# Patient Record
Sex: Female | Born: 2000 | Smoking: Never smoker
Health system: Southern US, Community
[De-identification: ages and names within clinical notes are randomized; demographics above are authoritative.]

## PROBLEM LIST (undated history)

## (undated) DIAGNOSIS — I2699 Other pulmonary embolism without acute cor pulmonale: Secondary | ICD-10-CM

## (undated) HISTORY — DX: Other pulmonary embolism without acute cor pulmonale: I26.99

## (undated) HISTORY — PX: NO PAST SURGERIES: SHX2092

---
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
## 2018-01-26 ENCOUNTER — Emergency Department (HOSPITAL_COMMUNITY): Payer: 59

## 2018-01-26 ENCOUNTER — Inpatient Hospital Stay (HOSPITAL_COMMUNITY)
Admission: EM | Admit: 2018-01-26 | Discharge: 2018-01-29 | DRG: 175 | Disposition: A | Discharge status: Home / Self Care | Payer: 59 | Attending: Internal Medicine | Admitting: Internal Medicine

## 2018-01-26 ENCOUNTER — Other Ambulatory Visit: Payer: Self-pay

## 2018-01-26 ENCOUNTER — Encounter (HOSPITAL_COMMUNITY): Payer: Self-pay

## 2018-01-26 DIAGNOSIS — Z3009 Encounter for other general counseling and advice on contraception: Secondary | ICD-10-CM | POA: Diagnosis not present

## 2018-01-26 DIAGNOSIS — I2609 Other pulmonary embolism with acute cor pulmonale: Secondary | ICD-10-CM | POA: Diagnosis not present

## 2018-01-26 DIAGNOSIS — I272 Pulmonary hypertension, unspecified: Secondary | ICD-10-CM | POA: Diagnosis present

## 2018-01-26 DIAGNOSIS — F329 Major depressive disorder, single episode, unspecified: Secondary | ICD-10-CM | POA: Diagnosis present

## 2018-01-26 DIAGNOSIS — I3139 Other pericardial effusion (noninflammatory): Secondary | ICD-10-CM

## 2018-01-26 DIAGNOSIS — I2699 Other pulmonary embolism without acute cor pulmonale: Secondary | ICD-10-CM | POA: Diagnosis present

## 2018-01-26 DIAGNOSIS — R55 Syncope and collapse: Secondary | ICD-10-CM | POA: Diagnosis present

## 2018-01-26 DIAGNOSIS — D649 Anemia, unspecified: Secondary | ICD-10-CM

## 2018-01-26 DIAGNOSIS — J9601 Acute respiratory failure with hypoxia: Secondary | ICD-10-CM | POA: Diagnosis present

## 2018-01-26 DIAGNOSIS — I5030 Unspecified diastolic (congestive) heart failure: Secondary | ICD-10-CM | POA: Diagnosis not present

## 2018-01-26 DIAGNOSIS — N920 Excessive and frequent menstruation with regular cycle: Secondary | ICD-10-CM | POA: Diagnosis present

## 2018-01-26 DIAGNOSIS — D500 Iron deficiency anemia secondary to blood loss (chronic): Secondary | ICD-10-CM | POA: Diagnosis present

## 2018-01-26 DIAGNOSIS — Z23 Encounter for immunization: Secondary | ICD-10-CM

## 2018-01-26 DIAGNOSIS — Z793 Long term (current) use of hormonal contraceptives: Secondary | ICD-10-CM | POA: Diagnosis not present

## 2018-01-26 DIAGNOSIS — I313 Pericardial effusion (noninflammatory): Secondary | ICD-10-CM | POA: Diagnosis present

## 2018-01-26 LAB — IRON AND TIBC
Iron: 15 ug/dL — ABNORMAL LOW (ref 28–170)
SATURATION RATIOS: 3 % — AB (ref 10.4–31.8)
TIBC: 447 ug/dL (ref 250–450)
UIBC: 432 ug/dL

## 2018-01-26 LAB — RETICULOCYTES
RBC.: 4.57 MIL/uL (ref 3.87–5.11)
RETIC CT PCT: 1.4 % (ref 0.4–3.1)
Retic Count, Absolute: 64 10*3/uL (ref 19.0–186.0)

## 2018-01-26 LAB — FERRITIN: FERRITIN: 6 ng/mL — AB (ref 11–307)

## 2018-01-26 LAB — BASIC METABOLIC PANEL
ANION GAP: 13 (ref 5–15)
BUN: 9 mg/dL (ref 6–20)
CALCIUM: 9.6 mg/dL (ref 8.9–10.3)
CO2: 21 mmol/L — ABNORMAL LOW (ref 22–32)
Chloride: 107 mmol/L (ref 98–111)
Creatinine, Ser: 0.88 mg/dL (ref 0.44–1.00)
Glucose, Bld: 103 mg/dL — ABNORMAL HIGH (ref 70–99)
POTASSIUM: 3.6 mmol/L (ref 3.5–5.1)
Sodium: 141 mmol/L (ref 135–145)

## 2018-01-26 LAB — URINALYSIS, ROUTINE W REFLEX MICROSCOPIC
GLUCOSE, UA: 50 mg/dL — AB
HGB URINE DIPSTICK: NEGATIVE
KETONES UR: 5 mg/dL — AB
LEUKOCYTES UA: NEGATIVE
NITRITE: NEGATIVE
Protein, ur: >=300 mg/dL — AB
Specific Gravity, Urine: 1.025 (ref 1.005–1.030)
pH: 6 (ref 5.0–8.0)

## 2018-01-26 LAB — VITAMIN B12: VITAMIN B 12: 224 pg/mL (ref 180–914)

## 2018-01-26 LAB — CBC
HCT: 33.5 % — ABNORMAL LOW (ref 36.0–46.0)
HEMOGLOBIN: 10 g/dL — AB (ref 12.0–15.0)
MCH: 20.9 pg — ABNORMAL LOW (ref 26.0–34.0)
MCHC: 29.9 g/dL — ABNORMAL LOW (ref 30.0–36.0)
MCV: 69.9 fL — ABNORMAL LOW (ref 78.0–100.0)
Platelets: 302 10*3/uL (ref 150–400)
RBC: 4.79 MIL/uL (ref 3.87–5.11)
RDW: 16.2 % — ABNORMAL HIGH (ref 11.5–15.5)
WBC: 8.3 10*3/uL (ref 4.0–10.5)

## 2018-01-26 LAB — I-STAT BETA HCG BLOOD, ED (MC, WL, AP ONLY): I-stat hCG, quantitative: <5 m[IU]/mL (ref <5)

## 2018-01-26 LAB — D-DIMER, QUANTITATIVE: D-Dimer, Quant: >20 ug/mL-FEU — ABNORMAL HIGH (ref 0.00–0.50)

## 2018-01-26 LAB — MRSA PCR SCREENING: MRSA by PCR: NEGATIVE

## 2018-01-26 LAB — FOLATE: Folate: 18.2 ng/mL (ref >5.9)

## 2018-01-26 LAB — CBG MONITORING, ED: Glucose-Capillary: 89 mg/dL (ref 70–99)

## 2018-01-26 LAB — TROPONIN I: TROPONIN I: 0.12 ng/mL — AB (ref <0.03)

## 2018-01-26 LAB — BRAIN NATRIURETIC PEPTIDE: B NATRIURETIC PEPTIDE 5: 541.4 pg/mL — AB (ref 0.0–100.0)

## 2018-01-26 MED ORDER — ACETAMINOPHEN 650 MG RE SUPP: 650.0000 mg | Freq: Four times a day (QID) | RECTAL | Status: DC | PRN

## 2018-01-26 MED ORDER — SODIUM CHLORIDE 0.9 % IV BOLUS
1000.0000 mL | Freq: Once | INTRAVENOUS | Status: AC
  Administered 2018-01-26: 1000 mL via INTRAVENOUS

## 2018-01-26 MED ORDER — IOPAMIDOL (ISOVUE-370) INJECTION 76%
INTRAVENOUS | Status: AC
  Filled 2018-01-26: qty 100

## 2018-01-26 MED ORDER — HEPARIN (PORCINE) IN NACL 100-0.45 UNIT/ML-% IJ SOLN
1200.0000 [IU]/h | INTRAMUSCULAR | Status: DC
  Administered 2018-01-26: 1200 [IU]/h via INTRAVENOUS
  Filled 2018-01-26: qty 250

## 2018-01-26 MED ORDER — SODIUM CHLORIDE 0.9 % IJ SOLN
INTRAMUSCULAR | Status: AC
  Administered 2018-01-26: 1000 mL
  Filled 2018-01-26: qty 50

## 2018-01-26 MED ORDER — IOPAMIDOL (ISOVUE-370) INJECTION 76%
100.0000 mL | Freq: Once | INTRAVENOUS | Status: AC | PRN
  Administered 2018-01-26: 100 mL via INTRAVENOUS

## 2018-01-26 MED ORDER — SODIUM CHLORIDE 0.9 % IV SOLN
INTRAVENOUS | Status: AC
  Administered 2018-01-26: 23:00:00 via INTRAVENOUS

## 2018-01-26 MED ORDER — ONDANSETRON HCL 4 MG/2ML IJ SOLN: 4.0000 mg | Freq: Four times a day (QID) | INTRAMUSCULAR | Status: DC | PRN

## 2018-01-26 MED ORDER — ONDANSETRON HCL 4 MG PO TABS: 4.0000 mg | ORAL_TABLET | Freq: Four times a day (QID) | ORAL | Status: DC | PRN

## 2018-01-26 MED ORDER — ACETAMINOPHEN 325 MG PO TABS
650.0000 mg | ORAL_TABLET | Freq: Four times a day (QID) | ORAL | Status: DC | PRN
  Administered 2018-01-29: 650 mg via ORAL
  Filled 2018-01-26: qty 2

## 2018-01-26 MED ORDER — HEPARIN BOLUS VIA INFUSION
2000.0000 [IU] | Freq: Once | INTRAVENOUS | Status: AC
  Administered 2018-01-26: 2000 [IU] via INTRAVENOUS
  Filled 2018-01-26: qty 2000

## 2018-01-26 NOTE — ED Notes (Signed)
Per lab request, I just re-sent a 2nd D-Dimer. Pt. Is pleasantly visiting with her family. She tells me she is comfortable, but hungry.

## 2018-01-26 NOTE — ED Notes (Signed)
Coming from [employer], states she had a syncopal episode in the bathroom and fell

## 2018-01-26 NOTE — ED Notes (Signed)
She ambulates without difficulty.

## 2018-01-26 NOTE — Progress Notes (Signed)
ANTICOAGULATION CONSULT NOTE - Initial Consult  Pharmacy Consult for heparin Indication: pulmonary embolus  Allergies  Allergen Reactions  . Amoxicillin-Pot Clavulanate Rash    Patient Measurements:   Heparin Dosing Weight: 61.2 kg  Vital Signs: Temp: 97.7 F (36.5 C) (10/01 1348) Temp Source: Oral (10/01 1348) BP: 115/92 (10/01 1437) Pulse Rate: 111 (10/01 1314)  Labs: Recent Labs    01/26/18 1327  HGB 10.0*  HCT 33.5*  PLT 302  CREATININE 0.88    CrCl cannot be calculated (Unknown ideal weight.).   Medical History: History reviewed. No pertinent past medical history.  Assessment:  17 yo with new B PE to start heparin drip.  CT Chest showed pulmonary embolism in the right main pulmonary artery with pulmonary embolus involving bilateral segmental total pulmonary artery of all lobes.  There is evidence of right heart strain.  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  Small pericardial effusion  Goal of Therapy:  Heparin level 0.3-0.7 units/ml Monitor platelets by anticoagulation protocol: Yes   Plan:  Give 2000 units bolus x 1 Start heparin infusion at 1200 units/hr Check anti-Xa level in 6 hours and daily while on heparin Continue to monitor H&H and platelets   Herby Abraham, Pharm.D [phone] 01/26/2018 6:00 PM

## 2018-01-26 NOTE — H&P (Signed)
Mariah Yang NWG:[id number] DOB: Dec 11, 2000 DOA: 01/26/2018   PCP: System, Pcp Not In   Outpatient Specialists:  none  Patient arrived to ER on 01/26/18 at 1256  Patient coming from: home Lives in the dorm  Chief Complaint:  Chief Complaint  Patient presents with  . 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  Patient was in her dorm felt lightheaded and passed out she was woken up by her roommates she has been somewhat short of breath for past few days short of breath and trying to ambulate.  Not sure if she hit her head.  Roommates felt that she was unconscious for 15 minutes.  Reportedly 2 weeks ago she was seen at the student health for shortness of breath and was given an inhaler thinking it was allergies.  She has been having occasional cough some headaches She is not a smoker, recently started on Birth control 4 months ago. Denies being pregnant no alcohol or drug use.  No family history of blood clots. NO leg swelling or pain.  Reports heavy menstrual bleeding She has been told she was anemic in the past.  No blood in stool. Last Period was 2 wks ago.  Patient traveled by car 5 h in [facility] August.  While in ER: D-dimer 20 CTA showed massive PE Started on Heparin  The following Work up has been ordered so far:  Orders Placed This Encounter  Procedures  . CT Head Wo Contrast  . DG Chest 2 View  . CT Angio Chest PE W and/or Wo Contrast  . Basic metabolic panel  . CBC  . Urinalysis, Routine w reflex microscopic  . D-dimer, quantitative (not at [facility])  . Heparin level (unfractionated)  . CBC  . Cardiac monitoring  . Saline Lock IV, Maintain IV access  . Check temperature  . Height and weight  . heparin per pharmacy consult  . Consult to hospitalist  [phone]  . Consult to intensivist  1853    . Consult to hospitalist  [phone]  . Consult to hospitalist  . Pulse oximetry, continuous  . CBG monitoring, ED  . I-Stat beta hCG blood, ED  . ED EKG  . EKG 12-Lead     Following Medications were ordered in ER: Medications  iopamidol (ISOVUE-370) 76 % injection (has no administration in time range)  sodium chloride 0.9 % injection (has no administration in time range)  heparin ADULT infusion 100 units/mL (25000 units/287mL sodium chloride 0.45%) (1,200 Units/hr Intravenous New Bag/Given 01/26/18 1837)  sodium chloride 0.9 % bolus 1,000 mL (0 mLs Intravenous Stopped 01/26/18 1700)  iopamidol (ISOVUE-370) 76 % injection 100 mL (100 mLs Intravenous Contrast Given 01/26/18 1659)  heparin bolus via infusion 2,000 Units (2,000 Units Intravenous Bolus from Bag 01/26/18 1840)    Significant initial  Findings: Abnormal Labs Reviewed  BASIC METABOLIC PANEL - Abnormal; Notable for the following components:      Result Value   CO2 21 (*)    Glucose, Bld 103 (*)    All other components within normal limits  CBC - Abnormal; Notable for the following components:   Hemoglobin 10.0 (*)    HCT 33.5 (*)    MCV 69.9 (*)    MCH 20.9 (*)    MCHC 29.9 (*)  RDW 16.2 (*)    All other components within normal limits  URINALYSIS, ROUTINE W REFLEX MICROSCOPIC - Abnormal; Notable for the following components:   Color, Urine AMBER (*)    APPearance CLOUDY (*)    Glucose, UA 50 (*)    Bilirubin Urine SMALL (*)    Ketones, ur 5 (*)    Protein, ur >=300 (*)    Bacteria, UA MANY (*)    All other components within normal limits  D-DIMER, QUANTITATIVE (NOT AT [facility]) - Abnormal; Notable for the following components:   D-Dimer, Quant >20.00 (*)    All other components within normal limits    Na 141 K 3.6  Cr  stable,   Lab Results  Component Value Date   CREATININE 0.88 01/26/2018      WBC 8.3 HG/HCT        Component Value Date/Time   HGB 10.0 (L) 01/26/2018 1327   HCT 33.5 (L) 01/26/2018 1327       Troponin (Point of Care Test) No results for input(s): TROPIPOC in the last 72 hours.      UA  no evidence of UTI     CT HEAD NON acute  CXR -  NON acute  CTA -massive pulmonary embolism in the right main pulmonary artery with bilateral segmental total pulmonary arteries of the lobes evidence of right heart strain Pulmonary infarcts identified bilateral lower lobes cardiomegaly and small pericardial effusion  ECG:  Personally reviewed by me showing:  HR : 107 Rhythm: Sinus tachycardia     no evidence of ischemic changes QTC 514    ED Triage Vitals  Enc Vitals Group     BP 01/26/18 1314 101/72     Pulse Rate 01/26/18 1314 (!) 111     Resp 01/26/18 1314 16     Temp 01/26/18 1348 97.7 F (36.5 C)     Temp Source 01/26/18 1314 Oral     SpO2 01/26/18 1314 95 %     Weight 01/26/18 1756 135 lb (61.2 kg)     Height 01/26/18 1756 5\' 5"  (1.651 m)     Head Circumference --      Peak Flow --      Pain Score 01/26/18 1307 0     Pain Loc --      Pain Edu? --      Excl. in GC? --   TMAX(24)@       Latest  Blood pressure 123/83, pulse 99, temperature 98.4 F (36.9 C), temperature source Oral, resp. rate (!) 21, height 5\' 5"  (1.651 m), weight 61.2 kg, last menstrual period 01/12/2018, SpO2 96 %.   Hospitalist was called for admission for massive PE   Review of Systems:    Pertinent positives include: dizziness, palpitations.syncope  shortness of breath at rest.   dyspnea on exertion,  Constitutional:  No weight loss, night sweats, Fevers, chills, fatigue, weight loss  HEENT:  No headaches, Difficulty swallowing,Tooth/dental problems,Sore throat,  No sneezing, itching, ear ache, nasal congestion, post nasal drip,  Cardio-vascular:  No chest pain, Orthopnea, PND, anasarca, no Bilateral lower extremity swelling  GI:  No heartburn, indigestion, abdominal pain, nausea, vomiting, diarrhea, change in bowel habits, loss of appetite, melena, blood in stool,  hematemesis Resp:  noNo excess mucus, no productive cough, No non-productive cough, No coughing up of blood.No change in color of mucus.No wheezing. Skin:  no rash or lesions. No jaundice GU:  no dysuria, change in color of urine, no urgency or frequency.  No straining to urinate.  No flank pain.  Musculoskeletal:  No joint pain or no joint swelling. No decreased range of motion. No back pain.  Psych:  No change in mood or affect. No depression or anxiety. No memory loss.  Neuro: no localizing neurological complaints, no tingling, no weakness, no double vision, no gait abnormality, no slurred speech, no confusion  All systems reviewed and apart from HOPI all are negative  Past Medical History:  History reviewed. No pertinent past medical history.    History reviewed. No pertinent surgical history.  Social History:  Ambulatory  Independently    reports that she has never smoked. She has never used smokeless tobacco. She reports that she does not drink alcohol or use drugs.     Family History:   Family History  Problem Relation Age of Onset  . Healthy Mother   . Healthy Father     Allergies: Allergies  Allergen Reactions  . Amoxicillin-Pot Clavulanate Rash     Prior to Admission medications   Medication Sig Start Date End Date Taking? Authorizing Provider  cyclobenzaprine (FLEXERIL) 10 MG tablet Take 10 mg by mouth daily as needed for muscle spasms.  01/15/18  Yes [provider]  ibuprofen (ADVIL,MOTRIN) 200 MG tablet Take 800 mg by mouth daily as needed for moderate pain.   Yes [provider]  PROAIR HFA 108 [phone] Base) MCG/ACT inhaler  01/06/18  Yes [provider]  TRI-LO-MARZIA 0.18/0.215/0.25 MG-25 MCG tab Take 1 tablet by mouth daily. 12/14/17  Yes [provider]  cephALEXin (KEFLEX) 500 MG capsule Take 1 capsule (500 mg total) by mouth 4 (four) times daily. Patient not taking: Reported on 01/26/2018 12/28/17   Wieters, Hallie C,  PA-C  Chlorhexidine Gluconate 2 % LIQD Apply 1 application topically daily. Patient not taking: Reported on 01/26/2018 12/28/17   Lew Dawes, PA-C   Physical Exam: Blood pressure 123/83, pulse 99, temperature 98.4 F (36.9 C), temperature source Oral, resp. rate (!) 21, height 5\' 5"  (1.651 m), weight 61.2 kg, last menstrual period 01/12/2018, SpO2 96 %. 1. General:  in No Acute distress  well  -appearing 2. Psychological: Alert and   Oriented 3. Head/ENT:    Dry Mucous Membranes                          Head Non traumatic, neck supple                          Normal  Dentition 4. SKIN: normal  Skin turgor,  Skin clean Dry and intact no rash 5. Heart: Regular rate and rhythm no  Murmur, no Rub or gallop 6. Lungs:  Clear to auscultation bilaterally, no wheezes or crackles   7. Abdomen: Soft, non-tender, Non distended bowel sounds present 8. Lower extremities: no clubbing, cyanosis, or edema 9. Neurologically Grossly intact, moving all 4 extremities equally   10. MSK: Normal range of motion   LABS:     Recent Labs  Lab 01/26/18 1327  WBC 8.3  HGB 10.0*  HCT 33.5*  MCV 69.9*  PLT 302   Basic Metabolic Panel: Recent Labs  Lab 01/26/18 1327  NA 141  K 3.6  CL 107  CO2 21*  GLUCOSE 103*  BUN 9  CREATININE 0.88  CALCIUM 9.6      No results for input(s): AST, ALT, ALKPHOS, BILITOT, PROT, ALBUMIN in the last 168 hours. No  results for input(s): LIPASE, AMYLASE in the last 168 hours. No results for input(s): AMMONIA in the last 168 hours.    HbA1C: No results for input(s): HGBA1C in the last 72 hours. CBG: Recent Labs  Lab 01/26/18 1320  GLUCAP 89      Urine analysis:    Component Value Date/Time   COLORURINE AMBER (A) 01/26/2018 1339   APPEARANCEUR CLOUDY (A) 01/26/2018 1339   LABSPEC 1.025 01/26/2018 1339   PHURINE 6.0 01/26/2018 1339   GLUCOSEU 50 (A) 01/26/2018 1339   HGBUR NEGATIVE 01/26/2018 1339   BILIRUBINUR SMALL (A) 01/26/2018 1339    KETONESUR 5 (A) 01/26/2018 1339   PROTEINUR >=300 (A) 01/26/2018 1339   NITRITE NEGATIVE 01/26/2018 1339   LEUKOCYTESUR NEGATIVE 01/26/2018 1339       Cultures: No results found for: SDES, SPECREQUEST, CULT, REPTSTATUS   Radiological Exams on Admission: Dg Chest 2 View  Result Date: 01/26/2018 CLINICAL DATA:  Short of breath and syncope. EXAM: CHEST - 2 VIEW COMPARISON:  None. FINDINGS: Mild enlargement of the cardiopericardial silhouette. No mediastinal or hilar masses. No evidence of adenopathy. Clear lungs.  No pleural effusion or pneumothorax. Skeletal structures are unremarkable. IMPRESSION: 1. No acute cardiopulmonary disease. 2. Mild cardiomegaly of unclear etiology in this young patient. Electronically Signed   By: Amie Portland M.D.   On: 01/26/2018 14:44   Ct Head Wo Contrast  Result Date: 01/26/2018 CLINICAL DATA:  Syncope.  Headache. EXAM: CT HEAD WITHOUT CONTRAST TECHNIQUE: Contiguous axial images were obtained from the base of the skull through the vertex without intravenous contrast. COMPARISON:  None. FINDINGS: Brain: No evidence of acute infarction, hemorrhage, hydrocephalus, extra-axial collection or mass lesion/mass effect. Vascular: No hyperdense vessel or unexpected calcification. Skull: Normal. Negative for fracture or focal lesion. Sinuses/Orbits: No acute finding. Other: None. IMPRESSION: Normal head CT. Electronically Signed   By: Lupita Raider, M.D.   On: 01/26/2018 14:25   Ct Angio Chest Pe W And/or Wo Contrast  Result Date: 01/26/2018 CLINICAL DATA:  Positive D-dimer test. EXAM: CT ANGIOGRAPHY CHEST WITH CONTRAST TECHNIQUE: Multidetector CT imaging of the chest was performed using the standard protocol during bolus administration of intravenous contrast. Multiplanar CT image reconstructions and MIPs were obtained to evaluate the vascular anatomy. CONTRAST:  ISOVUE-370 IOPAMIDOL (ISOVUE-370) INJECTION 76% COMPARISON:  Chest x-ray January 26, 2018 FINDINGS:  Cardiovascular: Large pulmonary embolus is identified in the right main pulmonary artery and in the bilateral segmental pulmonary arteries of all lobes. The aorta is normal. The heart size is enlarged. There is a small pericardial effusion. The right ventricular to left ventricular ratio is 1.57. Mediastinum/Nodes: No enlarged mediastinal, hilar, or axillary lymph nodes. Thyroid gland, trachea, and esophagus demonstrate no significant findings. Lungs/Pleura: There pulmonary infarcts involving the bilateral lower lobes there is no pleural effusion. Upper Abdomen: No acute abnormality. Musculoskeletal: No chest wall abnormality. No acute or significant osseous findings. Review of the MIP images confirms the above findings. IMPRESSION: Massive pulmonary embolus in the right main pulmonary artery with pulmonary embolus involving bilateral segment total pulmonary arteries of all lobes. There is evidence of right heart strain. Pulmonary infarcts are identified in bilateral lower lobes. Cardiomegaly.  Small pericardial effusion. Critical Value/emergent results were called by telephone at the time of interpretation on 01/26/2018 at 5:19 pm to Dr. Claude Manges , who verbally acknowledged these results. Electronically Signed   By: Sherian Rein M.D.   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 Pulmonary embolus and infarction (HCC) -  Admit to  stepdown Initiate heparin drip  Would likely benefit from case manager consult for long term anticoagulation  avoid hypotension Cycle cardiac enzymes Order echogram and lower extremity Dopplers  Most likely risk factors for hypercoagulable state being  hormonal therapy  recent travel  Discontinue birth control Would benefit from  hypercoagulable workup as an outpatient Given high protein in urine we will check ANA check albumin level   Given massive PE [facility] initially consulted    per PCCM patient not a candidate for tPA  . Syncope in the setting of massive PE await results of echogram monitor on telemetry monitor in stepdown given significant PE with possibility of right heart strain . Anemia -chronic will check anemia panel patient has history of heavy menstrual bleeding   Other plan as per orders.  DVT prophylaxis:  Heparin    Code Status:  FULL CODE  as per patient  I had personally discussed CODE STATUS with patient    Family Communication:   Family  not at  Bedside   Disposition Plan:    To home once workup is complete and patient is stable      Admission status: inpatient     Expect 2 midnight stay secondary to severity of patient's current illness     Severe lab abnormalities including severely elevated d-dimer     I expect  patient to be hospitalized for 2 midnights requiring inpatient medical care.  Patient is at high risk for adverse outcome (such as loss of life or disability) if not treated.  Indication for inpatient stay as follows:    Need for  IV fluids  IV medications And careful management and monitoring of life-threatening condition      Level of care    SDU tele indefinitely please discontinue once patient no longer qualifies       Drue Camera 01/26/2018, 10:19 PM    [facility]  Pager [phone]   after 2 AM please page floor coverage PA If 7AM-7PM, please contact the day team taking care of the patient  [email or web address]  Password [email or web address]

## 2018-01-26 NOTE — Progress Notes (Signed)
CRITICAL VALUE ALERT  Critical Value:  Troponin 0.12   Date & Time Notied:  01/26/18- 2355   Provider Notified: Otila Kluver NP   Orders Received/Actions taken: Pending

## 2018-01-26 NOTE — ED Triage Notes (Addendum)
Patient states, "I think I passed out." this AM Patient states her friend saw her,but does not know how long she was out. Patient does not know if she hit or head or not.

## 2018-01-26 NOTE — ED Notes (Signed)
She is in no distress; and is happily conversing with her family.

## 2018-01-27 ENCOUNTER — Inpatient Hospital Stay (HOSPITAL_COMMUNITY): Payer: 59

## 2018-01-27 DIAGNOSIS — I2699 Other pulmonary embolism without acute cor pulmonale: Secondary | ICD-10-CM

## 2018-01-27 DIAGNOSIS — I5030 Unspecified diastolic (congestive) heart failure: Secondary | ICD-10-CM

## 2018-01-27 DIAGNOSIS — D500 Iron deficiency anemia secondary to blood loss (chronic): Secondary | ICD-10-CM

## 2018-01-27 LAB — HEPARIN LEVEL (UNFRACTIONATED)
HEPARIN UNFRACTIONATED: 0.4 [IU]/mL (ref 0.30–0.70)
Heparin Unfractionated: 0.66 IU/mL (ref 0.30–0.70)
Heparin Unfractionated: 0.71 IU/mL — ABNORMAL HIGH (ref 0.30–0.70)

## 2018-01-27 LAB — COMPREHENSIVE METABOLIC PANEL
ALBUMIN: 3.3 g/dL — AB (ref 3.5–5.0)
ALK PHOS: 68 U/L (ref 38–126)
ALT: 30 U/L (ref 0–44)
AST: 27 U/L (ref 15–41)
Anion gap: 14 (ref 5–15)
BUN: 7 mg/dL (ref 6–20)
CO2: 19 mmol/L — AB (ref 22–32)
CREATININE: 0.84 mg/dL (ref 0.44–1.00)
Calcium: 9.3 mg/dL (ref 8.9–10.3)
Chloride: 109 mmol/L (ref 98–111)
GFR calc Af Amer: >60 mL/min (ref >60)
GFR calc non Af Amer: >60 mL/min (ref >60)
GLUCOSE: 80 mg/dL (ref 70–99)
Potassium: 3.6 mmol/L (ref 3.5–5.1)
SODIUM: 142 mmol/L (ref 135–145)
Total Bilirubin: 0.8 mg/dL (ref 0.3–1.2)
Total Protein: 7 g/dL (ref 6.5–8.1)

## 2018-01-27 LAB — ECHOCARDIOGRAM COMPLETE
HEIGHTINCHES: 65 in
Weight: 2070.56 oz

## 2018-01-27 LAB — TROPONIN I
TROPONIN I: 0.1 ng/mL — AB (ref <0.03)
TROPONIN I: 0.08 ng/mL — AB (ref <0.03)

## 2018-01-27 LAB — CBC
HEMATOCRIT: 30.4 % — AB (ref 36.0–46.0)
Hemoglobin: 9.2 g/dL — ABNORMAL LOW (ref 12.0–15.0)
MCH: 21.1 pg — ABNORMAL LOW (ref 26.0–34.0)
MCHC: 30.3 g/dL (ref 30.0–36.0)
MCV: 69.6 fL — AB (ref 78.0–100.0)
Platelets: 279 10*3/uL (ref 150–400)
RBC: 4.37 MIL/uL (ref 3.87–5.11)
RDW: 16.1 % — AB (ref 11.5–15.5)
WBC: 6.6 10*3/uL (ref 4.0–10.5)

## 2018-01-27 LAB — PHOSPHORUS: Phosphorus: 3.4 mg/dL (ref 2.5–4.6)

## 2018-01-27 LAB — HIV ANTIBODY (ROUTINE TESTING W REFLEX): HIV SCREEN 4TH GENERATION: NONREACTIVE

## 2018-01-27 LAB — MAGNESIUM: Magnesium: 2.1 mg/dL (ref 1.7–2.4)

## 2018-01-27 LAB — TSH: TSH: 4.08 u[IU]/mL (ref 0.350–4.500)

## 2018-01-27 MED ORDER — SODIUM CHLORIDE 0.9 % IV SOLN
510.0000 mg | Freq: Once | INTRAVENOUS | Status: AC
  Administered 2018-01-27: 510 mg via INTRAVENOUS
  Filled 2018-01-27: qty 17

## 2018-01-27 MED ORDER — HEPARIN (PORCINE) IN NACL 100-0.45 UNIT/ML-% IJ SOLN
1150.0000 [IU]/h | INTRAMUSCULAR | Status: AC
  Administered 2018-01-27: 1150 [IU]/h via INTRAVENOUS
  Filled 2018-01-27: qty 250

## 2018-01-27 MED ORDER — CYANOCOBALAMIN 1000 MCG/ML IJ SOLN
1000.0000 ug | Freq: Every day | INTRAMUSCULAR | Status: AC
  Administered 2018-01-27 – 2018-01-29 (×3): 1000 ug via SUBCUTANEOUS
  Filled 2018-01-27 (×3): qty 1

## 2018-01-27 MED ORDER — INFLUENZA VAC SPLIT QUAD 0.5 ML IM SUSY
0.5000 mL | PREFILLED_SYRINGE | INTRAMUSCULAR | Status: AC | PRN
  Administered 2018-01-29: 0.5 mL via INTRAMUSCULAR
  Filled 2018-01-27: qty 0.5

## 2018-01-27 NOTE — Progress Notes (Signed)
ANTICOAGULATION CONSULT NOTE - Follow Up Consult  Pharmacy Consult for Heparin Indication: pulmonary embolus  Allergies  Allergen Reactions  . Amoxicillin-Pot Clavulanate Rash    Patient Measurements: Height: 5\' 5"  (165.1 cm) Weight: 129 lb 6.6 oz (58.7 kg) IBW/kg (Calculated) : 57 Heparin Dosing Weight: 59 kg  Vital Signs: Temp: 98.7 F (37.1 C) (10/02 0400) Temp Source: Oral (10/02 0400) BP: 140/97 (10/02 0700) Pulse Rate: 105 (10/02 0700)  Labs: Recent Labs    01/26/18 1327 01/26/18 2202 01/27/18 0059 01/27/18 0819  HGB 10.0*  --   --  9.2*  HCT 33.5*  --   --  30.4*  PLT 302  --   --  279  HEPARINUNFRC  --   --  0.66 0.71*  CREATININE 0.88  --   --  0.84  TROPONINI  --  0.12*  --  0.10*    Estimated Creatinine Clearance: 92.5 mL/min (by C-G formula based on SCr of 0.88 mg/dL).   Medications:  Infusions:  . sodium chloride 100 mL/hr at 01/27/18 0200  . heparin 1,200 Units/hr (01/27/18 0200)    Assessment: 17 yoF admitted on 10/1 with syncopal episode, found to have acute bilateral PE.  No significant PMH, but reportedly has started birth control 4 months ago.   CTa shows massive PE with right heart strain.  Per PCCM patient not a candidate for tPA.  Pharmacy has been consulted for Heparin dosing.  Today, 01/27/2018: Heparin level (drawn early) 0.71, is just above the therapeutic range. CBC: Hgb 9.2 (decreased), Plt 279.   No bleeding or complications reported  Goal of Therapy:  Heparin level 0.3-0.7 units/ml Monitor platelets by anticoagulation protocol: Yes   Plan:   Decrease to heparin IV infusion at 1150 units/hr  Heparin level 6 hours after rate change  Daily heparin level and CBC  Monitor for s/s bleeding, thrombosis  Follow up other interventions, or long term anticoagulation plans   Lynann Beaver PharmD, BCPS Pager [phone] 01/27/2018 7:41 AM

## 2018-01-27 NOTE — Progress Notes (Signed)
  Echocardiogram 2D Echocardiogram has been performed.  Mariah Yang G Kairos Panetta 01/27/2018, 2:47 PM

## 2018-01-27 NOTE — Progress Notes (Signed)
Pt requested to have MD come back to speak to her mother in regards to her plan of care and her current medical condition. RN has texted paged MD. Waiting for MD to arrive or call RN back at this time.

## 2018-01-27 NOTE — Progress Notes (Signed)
ANTICOAGULATION CONSULT NOTE -  Consult  Pharmacy Consult for heparin Indication: pulmonary embolus  Allergies  Allergen Reactions  . Amoxicillin-Pot Clavulanate Rash    Patient Measurements: Height: 5\' 5"  (165.1 cm) Weight: 129 lb 6.6 oz (58.7 kg) IBW/kg (Calculated) : 57 Heparin Dosing Weight: 61.2 kg  Vital Signs: Temp: 98.8 F (37.1 C) (10/02 0000) Temp Source: Oral (10/02 0000) BP: 153/100 (10/01 2145) Pulse Rate: 110 (10/01 2145)  Labs: Recent Labs    01/26/18 1327 01/26/18 2202 01/27/18 0059  HGB 10.0*  --   --   HCT 33.5*  --   --   PLT 302  --   --   HEPARINUNFRC  --   --  0.66  CREATININE 0.88  --   --   TROPONINI  --  0.12*  --     Estimated Creatinine Clearance: 92.5 mL/min (by C-G formula based on SCr of 0.88 mg/dL).   Medical History: History reviewed. No pertinent past medical history.  Assessment:  17 yo with new B PE to start heparin drip.  CT Chest showed pulmonary embolism in the right main pulmonary artery with pulmonary embolus involving bilateral segmental total pulmonary artery of all lobes.  There is evidence of right heart strain.  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  Small pericardial effusion Today, 10/2  0059 HL= 0.66 at goal, no infusion or bleeding issues per RN.   Goal of Therapy:  Heparin level 0.3-0.7 units/ml Monitor platelets by anticoagulation protocol: Yes   Plan:  Continue heparin drip at 1200 units/hr Recheck HL today to ensure stays in therapeutic range Daily CBC/HL   Lorenza Evangelist 01/27/2018 2:32 AM

## 2018-01-27 NOTE — Progress Notes (Signed)
Bilateral lower extremity venous duplex completed -premininary results. There is no evidence of DVT or Baker's cyst. Toma Deiters, RVS 01/27/2018 4:31 PM

## 2018-01-27 NOTE — Progress Notes (Addendum)
PROGRESS NOTE    Mariah Yang   ZOX:[id number]  DOB: Sep 14, 2000  DOA: 01/26/2018 PCP: System, Pcp Not In   Brief Narrative:  Mariah Yang is a 17 y/o with a h/o anemia who presented for a syncopal episode while in the shower. She was unconscious for about 15 minutes. She has been short of breath on exertion lately and was started on birth control pills about 4 months ago for heavy menses.  CTA: Massive pulmonary embolus in the right main pulmonary artery with pulmonary embolus involving bilateral segment total pulmonary arteries of all lobes. There is evidence of right heart strain. Pulmonary infarcts are identified in bilateral lower lobes.  Subjective: Currently no dyspnea but feels a little light headed when ambulating to the bathroom.     Assessment & Plan:   Principal Problem:    Syncope/ Pulmonary embolus and infarction  - right heart strain noted on CT - on Heparin infusion- clinically, BP stable but HR mildly elevated - pulse ox 94% on room air - pulmonary consulted by ED- do not feel she need catheter directed TPA - cause may be recent start on OCP  - recommend hypercoagulable work up as outpt - 2 D ECHO and LE venous duplex pending  Active Problems:   Iron deficiency anemia due to chronic blood loss - will give Feraheme today- she will need a second dose in 1 wk - low normal B12 as well- will replace via s/c route while in hospital  Menorrhagia - will need to d/c OCP and she will need to f/u with her Gyn for further discussions about plan- unfortunately, due to anticoagulation, her bleeding will likely be substantially increased    DVT prophylaxis: Heparin infusion Code Status: Full code Family Communication: cousin at bedside Disposition Plan: follow in SDU-  Consultants:   none Procedures:   none Antimicrobials:  Anti-infectives (From admission, onward)   None       Objective: Vitals:   01/27/18 0900 01/27/18 1000 01/27/18 1100 01/27/18 1200    BP: (!) 134/91 140/86 109/77 110/82  Pulse: (!) 109 (!) 120 (!) 101 (!) 104  Resp: (!) 23 (!) 28 (!) 31 (!) 27  Temp:    98.4 F (36.9 C)  TempSrc:    Oral  SpO2: 95% 94% 92% 92%  Weight:      Height:        Intake/Output Summary (Last 24 hours) at 01/27/2018 1407 Last data filed at 01/27/2018 0500 Gross per 24 hour  Intake 563.13 ml  Output 600 ml  Net -36.87 ml   Filed Weights   01/26/18 1756 01/26/18 2145  Weight: 61.2 kg 58.7 kg    Examination: General exam: Appears comfortable  HEENT: PERRLA, oral mucosa moist, no sclera icterus or thrush Respiratory system: Clear to auscultation. Respiratory effort normal. Cardiovascular system: S1 & S2 heard, RRR.   Gastrointestinal system: Abdomen soft, non-tender, nondistended. Normal bowel sound. No organomegaly Central nervous system: Alert and oriented. No focal neurological deficits. Extremities: No cyanosis, clubbing or edema Skin: No rashes or ulcers Psychiatry:  Mood & affect appropriate.     Data Reviewed: I have personally reviewed following labs and imaging studies  CBC: Recent Labs  Lab 01/26/18 1327 01/27/18 0819  WBC 8.3 6.6  HGB 10.0* 9.2*  HCT 33.5* 30.4*  MCV 69.9* 69.6*  PLT 302 279   Basic Metabolic Panel: Recent Labs  Lab 01/26/18 1327 01/27/18 0819  NA 141 142  K 3.6 3.6  CL 107 109  CO2 21* 19*  GLUCOSE 103* 80  BUN 9 7  CREATININE 0.88 0.84  CALCIUM 9.6 9.3  MG  --  2.1  PHOS  --  3.4   GFR: Estimated Creatinine Clearance: 96.9 mL/min (by C-G formula based on SCr of 0.84 mg/dL). Liver Function Tests: Recent Labs  Lab 01/27/18 0819  AST 27  ALT 30  ALKPHOS 68  BILITOT 0.8  PROT 7.0  ALBUMIN 3.3*   No results for input(s): LIPASE, AMYLASE in the last 168 hours. No results for input(s): AMMONIA in the last 168 hours. Coagulation Profile: No results for input(s): INR, PROTIME in the last 168 hours. Cardiac Enzymes: Recent Labs  Lab 01/26/18 2202 01/27/18 0819  01/27/18 1047  TROPONINI 0.12* 0.10* 0.08*   BNP (last 3 results) No results for input(s): PROBNP in the last 8760 hours. HbA1C: No results for input(s): HGBA1C in the last 72 hours. CBG: Recent Labs  Lab 01/26/18 1320  GLUCAP 89   Lipid Profile: No results for input(s): CHOL, HDL, LDLCALC, TRIG, CHOLHDL, LDLDIRECT in the last 72 hours. Thyroid Function Tests: Recent Labs    01/27/18 0819  TSH 4.080   Anemia Panel: Recent Labs    01/26/18 2202  VITAMINB12 224  FOLATE 18.2  FERRITIN 6*  TIBC 447  IRON 15*  RETICCTPCT 1.4   Urine analysis:    Component Value Date/Time   COLORURINE AMBER (A) 01/26/2018 1339   APPEARANCEUR CLOUDY (A) 01/26/2018 1339   LABSPEC 1.025 01/26/2018 1339   PHURINE 6.0 01/26/2018 1339   GLUCOSEU 50 (A) 01/26/2018 1339   HGBUR NEGATIVE 01/26/2018 1339   BILIRUBINUR SMALL (A) 01/26/2018 1339   KETONESUR 5 (A) 01/26/2018 1339   PROTEINUR >=300 (A) 01/26/2018 1339   NITRITE NEGATIVE 01/26/2018 1339   LEUKOCYTESUR NEGATIVE 01/26/2018 1339   Sepsis Labs: @LABRCNTIP (procalcitonin:4,lacticidven:4) ) Recent Results (from the past 240 hour(s))  MRSA PCR Screening     Status: None   Collection Time: 01/26/18  9:51 PM  Result Value Ref Range Status   MRSA by PCR NEGATIVE NEGATIVE Final    Comment:        The GeneXpert MRSA Assay (FDA approved for NASAL specimens only), is one component of a comprehensive MRSA colonization surveillance program. It is not intended to diagnose MRSA infection nor to guide or monitor treatment for MRSA infections. Performed at [facility], [address], [place], [place] [address]          Radiology Studies: Dg Chest 2 View  Result Date: 01/26/2018 CLINICAL DATA:  Short of breath and syncope. EXAM: CHEST - 2 VIEW COMPARISON:  None. FINDINGS: Mild enlargement of the cardiopericardial silhouette. No mediastinal or hilar masses. No evidence of adenopathy. Clear lungs.  No pleural  effusion or pneumothorax. Skeletal structures are unremarkable. IMPRESSION: 1. No acute cardiopulmonary disease. 2. Mild cardiomegaly of unclear etiology in this young patient. Electronically Signed   By: Amie Portland M.D.   On: 01/26/2018 14:44   Ct Head Wo Contrast  Result Date: 01/26/2018 CLINICAL DATA:  Syncope.  Headache. EXAM: CT HEAD WITHOUT CONTRAST TECHNIQUE: Contiguous axial images were obtained from the base of the skull through the vertex without intravenous contrast. COMPARISON:  None. FINDINGS: Brain: No evidence of acute infarction, hemorrhage, hydrocephalus, extra-axial collection or mass lesion/mass effect. Vascular: No hyperdense vessel or unexpected calcification. Skull: Normal. Negative for fracture or focal lesion. Sinuses/Orbits: No acute finding. Other: None. IMPRESSION: Normal head CT. Electronically Signed   By: Lupita Raider,  M.D.   On: 01/26/2018 14:25   Ct Angio Chest Pe W And/or Wo Contrast  Result Date: 01/26/2018 CLINICAL DATA:  Positive D-dimer test. EXAM: CT ANGIOGRAPHY CHEST WITH CONTRAST TECHNIQUE: Multidetector CT imaging of the chest was performed using the standard protocol during bolus administration of intravenous contrast. Multiplanar CT image reconstructions and MIPs were obtained to evaluate the vascular anatomy. CONTRAST:  ISOVUE-370 IOPAMIDOL (ISOVUE-370) INJECTION 76% COMPARISON:  Chest x-ray January 26, 2018 FINDINGS: Cardiovascular: Large pulmonary embolus is identified in the right main pulmonary artery and in the bilateral segmental pulmonary arteries of all lobes. The aorta is normal. The heart size is enlarged. There is a small pericardial effusion. The right ventricular to left ventricular ratio is 1.57. Mediastinum/Nodes: No enlarged mediastinal, hilar, or axillary lymph nodes. Thyroid gland, trachea, and esophagus demonstrate no significant findings. Lungs/Pleura: There pulmonary infarcts involving the bilateral lower lobes there is no pleural  effusion. Upper Abdomen: No acute abnormality. Musculoskeletal: No chest wall abnormality. No acute or significant osseous findings. Review of the MIP images confirms the above findings. IMPRESSION: Massive pulmonary embolus in the right main pulmonary artery with pulmonary embolus involving bilateral segment total pulmonary arteries of all lobes. There is evidence of right heart strain. Pulmonary infarcts are identified in bilateral lower lobes. Cardiomegaly.  Small pericardial effusion. Critical Value/emergent results were called by telephone at the time of interpretation on 01/26/2018 at 5:19 pm to Dr. Claude Manges , who verbally acknowledged these results. Electronically Signed   By: Sherian Rein M.D.   On: 01/26/2018 17:22      Scheduled Meds: . cyanocobalamin  1,000 mcg Subcutaneous Daily   Continuous Infusions: . heparin 1,150 Units/hr (01/27/18 1342)     LOS: 1 day    Time spent in minutes: 35    Calvert Cantor, MD [facility] Pager: [email or web address] Password [email or web address] 01/27/2018, 2:07 PM

## 2018-01-27 NOTE — Care Management Note (Signed)
Case Management Note  Patient Details  Name: Mariah Yang MRN: [id number] Date of Birth: 2000-09-02  Subjective/Objective:                  FROM CHART: Presented with  Syncopal episode in  the shower.  Patient was in her dorm felt lightheaded and passed out she was woken up by her roommates she has been somewhat short of breath for past few days short of breath and trying to ambulate.  Not sure if she hit her head.  Roommates felt that she was unconscious for 15 minutes.  Reportedly 2 weeks ago she was seen at the student health for shortness of breath and was given an inhaler thinking it was allergies.  She has been having occasional cough some headaches She is not a smoker, recently started on Birth control 4 months ago. Denies being pregnant no alcohol or drug use.  No family history of blood clots. NO leg swelling or pain.  Reports heavy menstrual bleeding She has been told she was anemic in the past.  No blood in stool. Last Period was 2 wks ago.  Patient traveled by car 5 h in [facility] August.  While in ER: D-dimer 20 CTA showed massive PE Started on Heparin  The following Work up has been ordered so far:  Action/Plan: Will follow for progression of care and clinical status. Will follow for case management needs none present at this time.  Expected Discharge Date:                  Expected Discharge Plan:  Home/Self Care  In-House Referral:     Discharge planning Services  CM Consult  Post Acute Care Choice:    Choice offered to:     DME Arranged:    DME Agency:     HH Arranged:    HH Agency:     Status of Service:  In process, will continue to follow  If discussed at Long Length of Stay Meetings, dates discussed:    Additional Comments:  Golda Acre, RN 01/27/2018, 10:10 AM

## 2018-01-27 NOTE — Progress Notes (Addendum)
ANTICOAGULATION CONSULT NOTE - Follow Up Consult  Pharmacy Consult for Heparin Indication: pulmonary embolus  Allergies  Allergen Reactions  . Amoxicillin-Pot Clavulanate Rash    Patient Measurements: Height: 5\' 5"  (165.1 cm) Weight: 129 lb 6.6 oz (58.7 kg) IBW/kg (Calculated) : 57 Heparin Dosing Weight: 59 kg  Vital Signs: Temp: 98.5 F (36.9 C) (10/02 1600) Temp Source: Oral (10/02 1600) BP: 126/95 (10/02 1600) Pulse Rate: 113 (10/02 1600)  Labs: Recent Labs    01/26/18 1327 01/26/18 2202 01/27/18 0059 01/27/18 0819 01/27/18 1047 01/27/18 1558  HGB 10.0*  --   --  9.2*  --   --   HCT 33.5*  --   --  30.4*  --   --   PLT 302  --   --  279  --   --   HEPARINUNFRC  --   --  0.66 0.71*  --  0.40  CREATININE 0.88  --   --  0.84  --   --   TROPONINI  --  0.12*  --  0.10* 0.08*  --     Estimated Creatinine Clearance: 96.9 mL/min (by C-G formula based on SCr of 0.84 mg/dL).   Medications:  Infusions:  . heparin 1,150 Units/hr (01/27/18 1342)    Assessment: 17 yoF admitted on 10/1 with syncopal episode, found to have acute bilateral PE.  No significant PMH, but reportedly has started birth control 4 months ago.   CTa shows massive PE with right heart strain.  Per PCCM patient not a candidate for tPA.  Pharmacy has been consulted for Heparin dosing.  Today, 01/27/2018: Heparin level (drawn early) 0.71, is just above the therapeutic range. CBC: Hgb 9.2 (decreased), Plt 279.   No bleeding or complications reported PM follow up level is 0.40, therapeutic   Goal of Therapy:  Heparin level 0.3-0.7 units/ml Monitor platelets by anticoagulation protocol: Yes   Plan:   Continue  heparin IV infusion at 1150 units/hr  Daily heparin level and CBC  Monitor for s/s bleeding, thrombosis  Follow up other interventions, or long term anticoagulation plans    Adalberto Cole, PharmD, BCPS Pager [phone] 01/27/2018 5:50 PM

## 2018-01-28 DIAGNOSIS — R55 Syncope and collapse: Secondary | ICD-10-CM

## 2018-01-28 DIAGNOSIS — Z3009 Encounter for other general counseling and advice on contraception: Secondary | ICD-10-CM

## 2018-01-28 DIAGNOSIS — D500 Iron deficiency anemia secondary to blood loss (chronic): Secondary | ICD-10-CM

## 2018-01-28 DIAGNOSIS — I2609 Other pulmonary embolism with acute cor pulmonale: Secondary | ICD-10-CM

## 2018-01-28 DIAGNOSIS — J9601 Acute respiratory failure with hypoxia: Secondary | ICD-10-CM

## 2018-01-28 DIAGNOSIS — I2699 Other pulmonary embolism without acute cor pulmonale: Principal | ICD-10-CM

## 2018-01-28 LAB — URINE CULTURE: Culture: <10000 — AB

## 2018-01-28 LAB — HEPARIN LEVEL (UNFRACTIONATED): Heparin Unfractionated: 0.59 IU/mL (ref 0.30–0.70)

## 2018-01-28 LAB — CBC
HEMATOCRIT: 28.5 % — AB (ref 36.0–46.0)
Hemoglobin: 8.6 g/dL — ABNORMAL LOW (ref 12.0–15.0)
MCH: 21.1 pg — AB (ref 26.0–34.0)
MCHC: 30.2 g/dL (ref 30.0–36.0)
MCV: 70 fL — AB (ref 78.0–100.0)
Platelets: 267 10*3/uL (ref 150–400)
RBC: 4.07 MIL/uL (ref 3.87–5.11)
RDW: 16.3 % — ABNORMAL HIGH (ref 11.5–15.5)
WBC: 5.4 10*3/uL (ref 4.0–10.5)

## 2018-01-28 LAB — ANA W/REFLEX IF POSITIVE: Anti Nuclear Antibody(ANA): NEGATIVE

## 2018-01-28 MED ORDER — APIXABAN 5 MG PO TABS
10.0000 mg | ORAL_TABLET | Freq: Two times a day (BID) | ORAL | Status: DC
  Administered 2018-01-28 – 2018-01-29 (×3): 10 mg via ORAL
  Filled 2018-01-28 (×3): qty 2

## 2018-01-28 MED ORDER — APIXABAN 5 MG PO TABS: 5.0000 mg | ORAL_TABLET | Freq: Two times a day (BID) | ORAL | Status: DC

## 2018-01-28 NOTE — Progress Notes (Signed)
SATURATION QUALIFICATIONS: (This note is used to comply with regulatory documentation for home oxygen)  Patient Saturations on Room Air at Rest = 90%  Patient Saturations on Room Air while Ambulating = 84%  Patient Saturations on 3 Liters of oxygen while Ambulating = 92%  Please briefly explain why patient needs home oxygen: Pt was c/o SOB and O2 desaturation noted during ambulating.

## 2018-01-28 NOTE — Progress Notes (Signed)
During the night shift, the second time going to the BR, her O2 sats did not increase quickly to above 92%(O2 sats stayed b/t 88-90%); therefore, placed on 2L O2. Patient also reported shortness of breath during ambulation. O2 sats have stayed above 92% since placing on 2L O2. Patient now using BSC due to O2 continuing to drop when OOB. Continue to monitor.

## 2018-01-28 NOTE — Consult Note (Signed)
NAME:  Mariah Yang, MRN:  [id number], DOB:  2000/06/30, LOS: 2 ADMISSION DATE:  01/26/2018, CONSULTATION DATE:  10/3 REFERRING MD:  Butler Denmark, CHIEF COMPLAINT:  Submassive PE   Brief History   17 year old female started on oral birth control about 4 months prior. Admitted 9/25 w/ submassive Pulmonary Emboli.   Past Medical History  Anemia  Significant Hospital Events   10/1 admitted w/ acute bilateral PE w/ submassive PE. Placed on heparin. ECHO w/ evidence of RV strain.  10/2: PCCM asked to see.   Consults: date of consult/date signed off & final recs:  Pulm consult 10/2  Procedures (surgical and bedside):    Significant Diagnostic Tests:  CT chest 10/1: massive PE. Right main Pulm artery w/ PE involving bilateral segment total pulmonary arteries of all lobes. RV/LV ration 1.57 ECHO 10/1: EF 55-60%, D-shaped interventricular septum c/w RV pressure/volume overload. Mod dilated RV w/ mod to severe systolic dysfxn. Mild Pulmonary hypertension.  10/1 LE [place]: neg for PE  Micro Data:    Antimicrobials:    Subjective:  Feeling better. Still short of breath when ambulatory BUT no longer getting light headed.   Objective   Blood pressure 121/71, pulse (Abnormal) 101, temperature 98.2 F (36.8 C), temperature source Oral, resp. rate 19, height 5\' 5"  (1.651 m), weight 58.7 kg, last menstrual period 01/12/2018, SpO2 96 %.        Intake/Output Summary (Last 24 hours) at 01/28/2018 0831 Last data filed at 01/28/2018 0634 Gross per 24 hour  Intake 431.58 ml  Output 600 ml  Net -168.42 ml   Filed Weights   01/26/18 1756 01/26/18 2145  Weight: 61.2 kg 58.7 kg    Examination: General: Healthy appearing. NAD. Resting in bed HENT: NCAT. MMM no JVD Lungs: CTA no accessory use  Cardiovascular: tachy RRR  Abdomen: soft not tender + bowel sounds  Extremities: no edema brisk CR  Neuro: awake and alert  GU: clear   Resolved Hospital Problem list     Assessment & Plan:  Hypoxia in  setting of Submassive bilateral PE w/ intermediate/high risk 30d mortality based on sPesi score w/ + echo, trop findings. Likely provoked by oral contraceptives.  -clinically improving  Plan Ok to transition to NOAC (anticipating 3 month course) Would monitor on NOAC for 24 hrs then if no issues can be discharged Will need walking oximetry to determine O2 needs We will see her on Oct 14 as follow up to determine O2 needs Will need f/u echo in 6 weeks  Disposition / Summary of Today's Plan 01/28/18   Cont inpt monitoring another 24 hrs. If no issues can be released on NOAC     Diet: regular  Pain/Anxiety/Delirium protocol (if indicated): NA VAP protocol (if indicated): NA DVT prophylaxis: IV heparin transitioning to NOAC GI prophylaxis: NA Hyperglycemia protocol: NA Mobility: as tolerated Code Status: full code Family Communication: updated  Labs   CBC: Recent Labs  Lab 01/26/18 1327 01/27/18 0819 01/28/18 0343  WBC 8.3 6.6 5.4  HGB 10.0* 9.2* 8.6*  HCT 33.5* 30.4* 28.5*  MCV 69.9* 69.6* 70.0*  PLT 302 279 267    Basic Metabolic Panel: Recent Labs  Lab 01/26/18 1327 01/27/18 0819  NA 141 142  K 3.6 3.6  CL 107 109  CO2 21* 19*  GLUCOSE 103* 80  BUN 9 7  CREATININE 0.88 0.84  CALCIUM 9.6 9.3  MG  --  2.1  PHOS  --  3.4   GFR: Estimated Creatinine  Clearance: 96.9 mL/min (by C-G formula based on SCr of 0.84 mg/dL). Recent Labs  Lab 01/26/18 1327 01/27/18 0819 01/28/18 0343  WBC 8.3 6.6 5.4    Liver Function Tests: Recent Labs  Lab 01/27/18 0819  AST 27  ALT 30  ALKPHOS 68  BILITOT 0.8  PROT 7.0  ALBUMIN 3.3*   No results for input(s): LIPASE, AMYLASE in the last 168 hours. No results for input(s): AMMONIA in the last 168 hours.  ABG No results found for: PHART, PCO2ART, PO2ART, HCO3, TCO2, ACIDBASEDEF, O2SAT   Coagulation Profile: No results for input(s): INR, PROTIME in the last 168 hours.  Cardiac Enzymes: Recent Labs  Lab  01/26/18 2202 01/27/18 0819 01/27/18 1047  TROPONINI 0.12* 0.10* 0.08*    HbA1C: No results found for: HGBA1C  CBG: Recent Labs  Lab 01/26/18 1320  GLUCAP 89    Admitting History of Present Illness.   17 year old female w/ ho anemia. Apparently started on birth control pills about 4 months ago. Had been having initially intermittent shortness of breath while walking to class about 2 weeks prior. This persisted [facility] and given a SABA after having clear CXR and no swelling in legs. The symptoms persisted over the following 2 weeks. was associated w/ low back pain and then on day of admit had a syncopal episode on 10/1 while in shower. In ER found to have: elevated D-dimer, CT scan w/ bilateral PE w/ associated right heart strain as evidenced by RV/LV ratio 1.57, + troponin and mild hypoxia. Echo obtained showed normal LV fxn, D-shaped interventricular septum c/w RV volume overload and mild pulmonary hypertension. She was placed on oxygen and IV heparin. PCCM asked to see on 10/2 for recs re: transition to NOAC and to ensure she has pulmonary followup  Review of Systems:   Review of Systems - History obtained from the patient General ROS: positive for  - fatigue Psychological ROS: negative ENT ROS: negative Allergy and Immunology ROS: positive for - seasonal allergies Hematological and Lymphatic ROS: positive for - bleeding problems Endocrine ROS: negative Respiratory ROS: positive for - shortness of breath Cardiovascular ROS: no chest pain or dyspnea on exertion Gastrointestinal ROS: no abdominal pain, change in bowel habits, or black or bloody stools Genito-Urinary ROS: no dysuria, trouble voiding, or hematuria Musculoskeletal ROS: negative   Past Medical History  She,  has no past medical history on file.   Surgical History   History reviewed. No pertinent surgical history.   Social History   Social History   Socioeconomic History  . Marital  status: Single    Spouse name: Not on file  . Number of children: Not on file  . Years of education: Not on file  . Highest education level: Not on file  Occupational History  . Not on file  Social Needs  . Financial resource strain: Not on file  . Food insecurity:    Worry: Not on file    Inability: Not on file  . Transportation needs:    Medical: Not on file    Non-medical: Not on file  Tobacco Use  . Smoking status: Never Smoker  . Smokeless tobacco: Never Used  Substance and Sexual Activity  . Alcohol use: Never    Frequency: Never  . Drug use: Never  . Sexual activity: Not on file  Lifestyle  . Physical activity:    Days per week: Not on file    Minutes per session: Not on  file  . Stress: Not on file  Relationships  . Social connections:    Talks on phone: Not on file    Gets together: Not on file    Attends religious service: Not on file    Active member of club or organization: Not on file    Attends meetings of clubs or organizations: Not on file    Relationship status: Not on file  . Intimate partner violence:    Fear of current or ex partner: Not on file    Emotionally abused: Not on file    Physically abused: Not on file    Forced sexual activity: Not on file  Other Topics Concern  . Not on file  Social History Narrative  . Not on file  ,  reports that she has never smoked. She has never used smokeless tobacco. She reports that she does not drink alcohol or use drugs.   Family History   Her family history includes Diabetes in her other; Healthy in her father and mother. There is no history of Cancer, Stroke, CAD, Clotting disorder, Lupus, or Kidney disease.   Allergies Allergies  Allergen Reactions  . Amoxicillin-Pot Clavulanate Rash     Home Medications  Prior to Admission medications   Medication Sig Start Date End Date Taking? Authorizing Provider  cyclobenzaprine (FLEXERIL) 10 MG tablet Take 10 mg by mouth daily as needed for muscle spasms.   01/15/18  Yes [provider]  ibuprofen (ADVIL,MOTRIN) 200 MG tablet Take 800 mg by mouth daily as needed for moderate pain.   Yes [provider]  PROAIR HFA 108 [phone] Base) MCG/ACT inhaler  01/06/18  Yes [provider]  TRI-LO-MARZIA 0.18/0.215/0.25 MG-25 MCG tab Take 1 tablet by mouth daily. 12/14/17  Yes [provider]  cephALEXin (KEFLEX) 500 MG capsule Take 1 capsule (500 mg total) by mouth 4 (four) times daily. Patient not taking: Reported on 01/26/2018 12/28/17   Wieters, Hallie C, PA-C  Chlorhexidine Gluconate 2 % LIQD Apply 1 application topically daily. Patient not taking: Reported on 01/26/2018 12/28/17   Lew Dawes, PA-C     Simonne Martinet ACNP-BC [facility] Pulmonary/Critical Care Pager # [phone] OR # [phone] if no answer

## 2018-01-28 NOTE — Progress Notes (Signed)
  January 28, 2018  Patient: Luke Rigsbee  Date of Birth: 2001/03/03  Date of Visit: 01/26/2018    To Whom It May Concern:  Mistie Adney was seen and treated in our intensive care unit from 10/1 until 10/4. She will be discharged to home on oxygen.   Phillipa Morden  may return to school on 10/14 after being cleared by our office for activity.   Sincerely,   Simonne Martinet ACNP-BC [facility] Pulmonary/Critical Care [phone]

## 2018-01-28 NOTE — Progress Notes (Signed)
ANTICOAGULATION CONSULT NOTE - Follow Up Consult  Pharmacy Consult for Apixaban (Eliquis) Indication: pulmonary embolus  Allergies  Allergen Reactions  . Amoxicillin-Pot Clavulanate Rash    Patient Measurements: Height: 5\' 5"  (165.1 cm) Weight: 129 lb 6.6 oz (58.7 kg) IBW/kg (Calculated) : 57 Heparin Dosing Weight: 59 kg  Vital Signs: Temp: 98.2 F (36.8 C) (10/03 0755) Temp Source: Oral (10/03 0755) BP: 121/71 (10/03 0600) Pulse Rate: 101 (10/03 0600)  Labs: Recent Labs    01/26/18 1327 01/26/18 2202  01/27/18 0819 01/27/18 1047 01/27/18 1558 01/28/18 0343  HGB 10.0*  --   --  9.2*  --   --  8.6*  HCT 33.5*  --   --  30.4*  --   --  28.5*  PLT 302  --   --  279  --   --  267  HEPARINUNFRC  --   --    < > 0.71*  --  0.40 0.59  CREATININE 0.88  --   --  0.84  --   --   --   TROPONINI  --  0.12*  --  0.10* 0.08*  --   --    < > = values in this interval not displayed.    Estimated Creatinine Clearance: 96.9 mL/min (by C-G formula based on SCr of 0.84 mg/dL).   Medications:  Infusions:  . heparin 1,150 Units/hr (01/28/18 0306)    Assessment: 17 yoF admitted on 10/1 with syncopal episode, found to have acute bilateral PE.  No significant PMH, but reportedly has started birth control 4 months ago.   CTa shows massive PE with right heart strain.  Per PCCM patient not a candidate for tPA.  Pharmacy has been consulted for Heparin dosing.  Today, 01/28/2018: Heparin level 0.59, within the therapeutic range on heparin at 1150 units/hr CBC: Hgb further decreased to 8.6, Plt remain WNL 267.   Noted history of anemia, s/p Feraheme 01/27/18 No bleeding or complications reported   Goal of Therapy:  Heparin level 0.3-0.7 units/ml Monitor platelets by anticoagulation protocol: Yes   Plan:   At 10:00, stop Heparin infusion, and start apixaban  Apixaban 10 mg PO BID x 7 days, then 5 mg PO BID.  Monitor for s/s bleeding, thrombosis  Pharmacy to provide education and  discount card prior to discharge.   Lynann Beaver PharmD, BCPS Pager [phone] 01/28/2018 8:58 AM

## 2018-01-28 NOTE — Discharge Instructions (Signed)
Information on my medicine - ELIQUIS (apixaban)  This medication education was reviewed with me or my healthcare representative as part of my discharge preparation.  The pharmacist that spoke with me during my hospital stay was:   Wynona Canes  Why was Eliquis prescribed for you? Eliquis was prescribed to treat blood clots that may have been found in the veins of your legs (deep vein thrombosis) or in your lungs (pulmonary embolism) and to reduce the risk of them occurring again.  What do You need to know about Eliquis ? The starting dose is 10 mg (two 5 mg tablets) taken TWICE daily for the FIRST SEVEN (7) DAYS, then on 02/04/18  the dose is reduced to ONE 5 mg tablet taken TWICE daily.  Eliquis may be taken with or without food.   Try to take the dose about the same time in the morning and in the evening. If you have difficulty swallowing the tablet whole please discuss with your pharmacist how to take the medication safely.  Take Eliquis exactly as prescribed and DO NOT stop taking Eliquis without talking to the doctor who prescribed the medication.  Stopping may increase your risk of developing a new blood clot.  Refill your prescription before you run out.  After discharge, you should have regular check-up appointments with your healthcare provider that is prescribing your Eliquis.    What do you do if you miss a dose? If a dose of ELIQUIS is not taken at the scheduled time, take it as soon as possible on the same day and twice-daily administration should be resumed. The dose should not be doubled to make up for a missed dose.  Important Safety Information A possible side effect of Eliquis is bleeding. You should call your healthcare provider right away if you experience any of the following: ? Bleeding from an injury or your nose that does not stop. ? Unusual colored urine (red or dark brown) or unusual colored stools (red or black). ? Unusual bruising for unknown reasons. ? A  serious fall or if you hit your head (even if there is no bleeding).  Some medicines may interact with Eliquis and might increase your risk of bleeding or clotting while on Eliquis. To help avoid this, consult your healthcare provider or pharmacist prior to using any new prescription or non-prescription medications, including herbals, vitamins, non-steroidal anti-inflammatory drugs (NSAIDs) and supplements.  This website has more information on Eliquis (apixaban): http://[email or web address].com/eliquis/home

## 2018-01-28 NOTE — Progress Notes (Signed)
PROGRESS NOTE    Mariah Yang   ZOX:[id number]  DOB: Sep 09, 2000  DOA: 01/26/2018 PCP: System, Pcp Not In   Brief Narrative:  Mariah Yang is a 17 y/o with a h/o anemia who presented for a syncopal episode while in the shower. She was unconscious for about 15 minutes. She has been short of breath on exertion lately and was started on birth control pills about 4 months ago for heavy menses.  CTA: Massive pulmonary embolus in the right main pulmonary artery with pulmonary embolus involving bilateral segment total pulmonary arteries of all lobes. There is evidence of right heart strain. Pulmonary infarcts are identified in bilateral lower lobes.  Subjective: Continues to feel light headed when ambulating to the bathroom. Has some pain in her mid back as well when exerting herself.     Assessment & Plan:   Principal Problem:    Syncope/ Pulmonary embolus and infarction  - right heart strain noted on CT - on Heparin infusion- clinically, BP stable but HR mildly elevated - pulse ox 94% on room air but drops to low 80s on exertion- will need to set up for home O2 @ 3 L  - pulmonary consulted by ED- they do not feel she need catheter directed TPA at this point but she does need close follow up - cause of PE may be recent start on OCP which has been discontinued  - recommend hypercoagulable work up as outpt - 2 D ECHO noted below show RV strain- LE venous duplex negative for DVT  Active Problems:   Iron deficiency anemia due to chronic blood loss -  given Feraheme on 10/2- she will need a second dose in 1 wk - low normal B12 as well- will replace via s/c route while in hospital  Menorrhagia -have to d/c's OCP and she will need to f/u with her Gyn for further discussions about plan- unfortunately, due to anticoagulation, her bleeding will likely be substantially increased    DVT prophylaxis: Heparin infusion> transition to Eliquis today Code Status: Full code Family Communication:  cousin and mother at bedside Disposition Plan: follow in SDU-  Consultants:   none Procedures:   none Antimicrobials:  Anti-infectives (From admission, onward)   None       Objective: Vitals:   01/28/18 0755 01/28/18 0800 01/28/18 0900 01/28/18 1156  BP:  129/87 (!) 158/98   Pulse:  93 (!) 102   Resp:  (!) 23 (!) 26   Temp: 98.2 F (36.8 C)   (!) 97.2 F (36.2 C)  TempSrc: Oral   Oral  SpO2:  97% 98%   Weight:      Height:        Intake/Output Summary (Last 24 hours) at 01/28/2018 1220 Last data filed at 01/28/2018 0634 Gross per 24 hour  Intake 431.58 ml  Output 600 ml  Net -168.42 ml   Filed Weights   01/26/18 1756 01/26/18 2145  Weight: 61.2 kg 58.7 kg    Examination: General exam: Appears comfortable  HEENT: PERRLA, oral mucosa moist, no sclera icterus or thrush Respiratory system: Clear to auscultation. Respiratory effort normal. Cardiovascular system: S1 & S2 heard, RRR.   Gastrointestinal system: Abdomen soft, non-tender, nondistended. Normal bowel sound. No organomegaly Central nervous system: Alert and oriented. No focal neurological deficits. Extremities: No cyanosis, clubbing or edema Skin: No rashes or ulcers Psychiatry:  Depressed, tearful    Data Reviewed: I have personally reviewed following labs and imaging studies  CBC: Recent Labs  Lab  01/26/18 1327 01/27/18 0819 01/28/18 0343  WBC 8.3 6.6 5.4  HGB 10.0* 9.2* 8.6*  HCT 33.5* 30.4* 28.5*  MCV 69.9* 69.6* 70.0*  PLT 302 279 267   Basic Metabolic Panel: Recent Labs  Lab 01/26/18 1327 01/27/18 0819  NA 141 142  K 3.6 3.6  CL 107 109  CO2 21* 19*  GLUCOSE 103* 80  BUN 9 7  CREATININE 0.88 0.84  CALCIUM 9.6 9.3  MG  --  2.1  PHOS  --  3.4   GFR: Estimated Creatinine Clearance: 96.9 mL/min (by C-G formula based on SCr of 0.84 mg/dL). Liver Function Tests: Recent Labs  Lab 01/27/18 0819  AST 27  ALT 30  ALKPHOS 68  BILITOT 0.8  PROT 7.0  ALBUMIN 3.3*   No  results for input(s): LIPASE, AMYLASE in the last 168 hours. No results for input(s): AMMONIA in the last 168 hours. Coagulation Profile: No results for input(s): INR, PROTIME in the last 168 hours. Cardiac Enzymes: Recent Labs  Lab 01/26/18 2202 01/27/18 0819 01/27/18 1047  TROPONINI 0.12* 0.10* 0.08*   BNP (last 3 results) No results for input(s): PROBNP in the last 8760 hours. HbA1C: No results for input(s): HGBA1C in the last 72 hours. CBG: Recent Labs  Lab 01/26/18 1320  GLUCAP 89   Lipid Profile: No results for input(s): CHOL, HDL, LDLCALC, TRIG, CHOLHDL, LDLDIRECT in the last 72 hours. Thyroid Function Tests: Recent Labs    01/27/18 0819  TSH 4.080   Anemia Panel: Recent Labs    01/26/18 2202  VITAMINB12 224  FOLATE 18.2  FERRITIN 6*  TIBC 447  IRON 15*  RETICCTPCT 1.4   Urine analysis:    Component Value Date/Time   COLORURINE AMBER (A) 01/26/2018 1339   APPEARANCEUR CLOUDY (A) 01/26/2018 1339   LABSPEC 1.025 01/26/2018 1339   PHURINE 6.0 01/26/2018 1339   GLUCOSEU 50 (A) 01/26/2018 1339   HGBUR NEGATIVE 01/26/2018 1339   BILIRUBINUR SMALL (A) 01/26/2018 1339   KETONESUR 5 (A) 01/26/2018 1339   PROTEINUR >=300 (A) 01/26/2018 1339   NITRITE NEGATIVE 01/26/2018 1339   LEUKOCYTESUR NEGATIVE 01/26/2018 1339   Sepsis Labs: @LABRCNTIP (procalcitonin:4,lacticidven:4) ) Recent Results (from the past 240 hour(s))  Urine Culture     Status: Abnormal   Collection Time: 01/26/18  1:39 PM  Result Value Ref Range Status   Specimen Description   Final    URINE, CLEAN CATCH Performed at [facility], [address] [place], [place] [address]    Special Requests   Final    NONE Performed at [facility], [address], [place], [place] [address]    Culture (A)  Final    <10,000 COLONIES/mL INSIGNIFICANT GROWTH Performed at [facility], [address] [place], [place] [address]    Report Status 01/28/2018  FINAL  Final  MRSA PCR Screening     Status: None   Collection Time: 01/26/18  9:51 PM  Result Value Ref Range Status   MRSA by PCR NEGATIVE NEGATIVE Final    Comment:        The GeneXpert MRSA Assay (FDA approved for NASAL specimens only), is one component of a comprehensive MRSA colonization surveillance program. It is not intended to diagnose MRSA infection nor to guide or monitor treatment for MRSA infections. Performed at [facility], [address], [place], [place] [address]          Radiology Studies: Dg Chest 2 View  Result  Date: 01/26/2018 CLINICAL DATA:  Short of breath and syncope. EXAM: CHEST - 2 VIEW COMPARISON:  None. FINDINGS: Mild enlargement of the cardiopericardial silhouette. No mediastinal or hilar masses. No evidence of adenopathy. Clear lungs.  No pleural effusion or pneumothorax. Skeletal structures are unremarkable. IMPRESSION: 1. No acute cardiopulmonary disease. 2. Mild cardiomegaly of unclear etiology in this young patient. Electronically Signed   By: Amie Portland M.D.   On: 01/26/2018 14:44   Ct Head Wo Contrast  Result Date: 01/26/2018 CLINICAL DATA:  Syncope.  Headache. EXAM: CT HEAD WITHOUT CONTRAST TECHNIQUE: Contiguous axial images were obtained from the base of the skull through the vertex without intravenous contrast. COMPARISON:  None. FINDINGS: Brain: No evidence of acute infarction, hemorrhage, hydrocephalus, extra-axial collection or mass lesion/mass effect. Vascular: No hyperdense vessel or unexpected calcification. Skull: Normal. Negative for fracture or focal lesion. Sinuses/Orbits: No acute finding. Other: None. IMPRESSION: Normal head CT. Electronically Signed   By: Lupita Raider, M.D.   On: 01/26/2018 14:25   Ct Angio Chest Pe W And/or Wo Contrast  Result Date: 01/26/2018 CLINICAL DATA:  Positive D-dimer test. EXAM: CT ANGIOGRAPHY CHEST WITH CONTRAST TECHNIQUE: Multidetector CT imaging of the chest was performed  using the standard protocol during bolus administration of intravenous contrast. Multiplanar CT image reconstructions and MIPs were obtained to evaluate the vascular anatomy. CONTRAST:  ISOVUE-370 IOPAMIDOL (ISOVUE-370) INJECTION 76% COMPARISON:  Chest x-ray January 26, 2018 FINDINGS: Cardiovascular: Large pulmonary embolus is identified in the right main pulmonary artery and in the bilateral segmental pulmonary arteries of all lobes. The aorta is normal. The heart size is enlarged. There is a small pericardial effusion. The right ventricular to left ventricular ratio is 1.57. Mediastinum/Nodes: No enlarged mediastinal, hilar, or axillary lymph nodes. Thyroid gland, trachea, and esophagus demonstrate no significant findings. Lungs/Pleura: There pulmonary infarcts involving the bilateral lower lobes there is no pleural effusion. Upper Abdomen: No acute abnormality. Musculoskeletal: No chest wall abnormality. No acute or significant osseous findings. Review of the MIP images confirms the above findings. IMPRESSION: Massive pulmonary embolus in the right main pulmonary artery with pulmonary embolus involving bilateral segment total pulmonary arteries of all lobes. There is evidence of right heart strain. Pulmonary infarcts are identified in bilateral lower lobes. Cardiomegaly.  Small pericardial effusion. Critical Value/emergent results were called by telephone at the time of interpretation on 01/26/2018 at 5:19 pm to Dr. Claude Manges , who verbally acknowledged these results. Electronically Signed   By: Sherian Rein M.D.   On: 01/26/2018 17:22      Scheduled Meds: . apixaban  10 mg Oral BID   Followed by  . [START ON 02/04/2018] apixaban  5 mg Oral BID  . cyanocobalamin  1,000 mcg Subcutaneous Daily   Continuous Infusions:    LOS: 2 days    Time spent in minutes: 35    Calvert Cantor, MD [facility] Pager: [email or web address] Password [email or web address] 01/28/2018, 12:20 PM

## 2018-01-28 NOTE — Progress Notes (Signed)
ANTICOAGULATION CONSULT NOTE - Follow Up Consult  Pharmacy Consult for Heparin Indication: pulmonary embolus  Allergies  Allergen Reactions  . Amoxicillin-Pot Clavulanate Rash    Patient Measurements: Height: 5\' 5"  (165.1 cm) Weight: 129 lb 6.6 oz (58.7 kg) IBW/kg (Calculated) : 57 Heparin Dosing Weight: 59 kg  Vital Signs: Temp: 97.6 F (36.4 C) (10/03 0400) Temp Source: Oral (10/03 0400) BP: 121/71 (10/03 0600) Pulse Rate: 101 (10/03 0600)  Labs: Recent Labs    01/26/18 1327 01/26/18 2202  01/27/18 0819 01/27/18 1047 01/27/18 1558 01/28/18 0343  HGB 10.0*  --   --  9.2*  --   --  8.6*  HCT 33.5*  --   --  30.4*  --   --  28.5*  PLT 302  --   --  279  --   --  267  HEPARINUNFRC  --   --    < > 0.71*  --  0.40 0.59  CREATININE 0.88  --   --  0.84  --   --   --   TROPONINI  --  0.12*  --  0.10* 0.08*  --   --    < > = values in this interval not displayed.    Estimated Creatinine Clearance: 96.9 mL/min (by C-G formula based on SCr of 0.84 mg/dL).   Medications:  Infusions:  . heparin 1,150 Units/hr (01/28/18 0306)    Assessment: 17 yoF admitted on 10/1 with syncopal episode, found to have acute bilateral PE.  No significant PMH, but reportedly has started birth control 4 months ago.   CTa shows massive PE with right heart strain.  Per PCCM patient not a candidate for tPA.  Pharmacy has been consulted for Heparin dosing.  Today, 01/28/2018: Heparin level 0.59, within the therapeutic range on heparin at 1150 units/hr CBC: Hgb further decreased to 8.6, Plt remain WNL 267.   No bleeding or complications reported   Goal of Therapy:  Heparin level 0.3-0.7 units/ml Monitor platelets by anticoagulation protocol: Yes   Plan:   Continue  heparin IV infusion at 1150 units/hr  Daily heparin level and CBC  Monitor for s/s bleeding, thrombosis  Follow up other interventions, or long term anticoagulation plans   Lynann Beaver PharmD, BCPS Pager  [phone] 01/28/2018 7:40 AM

## 2018-01-29 LAB — HEXAGONAL PHASE PHOSPHOLIPID: HEXAGONAL PHASE PHOSPHOLIPID: 1 s (ref 0–11)

## 2018-01-29 LAB — CBC
HCT: 28.2 % — ABNORMAL LOW (ref 36.0–46.0)
Hemoglobin: 8.4 g/dL — ABNORMAL LOW (ref 12.0–15.0)
MCH: 20.7 pg — ABNORMAL LOW (ref 26.0–34.0)
MCHC: 29.8 g/dL — ABNORMAL LOW (ref 30.0–36.0)
MCV: 69.6 fL — AB (ref 78.0–100.0)
PLATELETS: 250 10*3/uL (ref 150–400)
RBC: 4.05 MIL/uL (ref 3.87–5.11)
RDW: 16.4 % — AB (ref 11.5–15.5)
WBC: 6.2 10*3/uL (ref 4.0–10.5)

## 2018-01-29 LAB — DRVVT MIX: DRVVT MIX: 41 s (ref 0.0–47.0)

## 2018-01-29 LAB — LUPUS ANTICOAGULANT PANEL
DRVVT: 48.9 s — ABNORMAL HIGH (ref 0.0–47.0)
PTT Lupus Anticoagulant: 54.2 s — ABNORMAL HIGH (ref 0.0–51.9)

## 2018-01-29 LAB — PTT-LA MIX: PTT-LA Mix: 49.6 s — ABNORMAL HIGH (ref 0.0–48.9)

## 2018-01-29 MED ORDER — VITAMIN B-12 1000 MCG PO TABS: 1000.0000 ug | ORAL_TABLET | Freq: Every day | ORAL | 0 refills | Status: DC

## 2018-01-29 MED ORDER — APIXABAN 5 MG PO TABS: 5.0000 mg | ORAL_TABLET | Freq: Two times a day (BID) | ORAL | 0 refills | Status: DC

## 2018-01-29 MED ORDER — POLYETHYLENE GLYCOL 3350 17 G PO PACK: 17.0000 g | PACK | Freq: Every day | ORAL | 0 refills | Status: DC

## 2018-01-29 MED ORDER — ORAL CARE MOUTH RINSE: 15.0000 mL | Freq: Two times a day (BID) | OROMUCOSAL | Status: DC

## 2018-01-29 MED ORDER — APIXABAN 5 MG PO TABS: 10.0000 mg | ORAL_TABLET | Freq: Two times a day (BID) | ORAL | 0 refills | Status: DC

## 2018-01-29 MED ORDER — FERROUS SULFATE 325 (65 FE) MG PO TBEC: 325.0000 mg | DELAYED_RELEASE_TABLET | Freq: Two times a day (BID) | ORAL | 3 refills | Status: DC

## 2018-01-29 MED ORDER — POLYETHYLENE GLYCOL 3350 17 G PO PACK
17.0000 g | PACK | Freq: Every day | ORAL | Status: DC
  Filled 2018-01-29: qty 1

## 2018-01-29 NOTE — Progress Notes (Signed)
NAME:  Mariah Yang, MRN:  [id number], DOB:  08-30-2000, LOS: 3 ADMISSION DATE:  01/26/2018, CONSULTATION DATE:  10/3 REFERRING MD:  Butler Denmark, CHIEF COMPLAINT:  Submassive PE   Brief History   17 year old female started on oral birth control about 4 months prior. Admitted 9/25 w/ submassive Pulmonary Emboli.   Past Medical History  Anemia  Significant Hospital Events   10/1 admitted w/ acute bilateral PE w/ submassive PE. Placed on heparin. ECHO w/ evidence of RV strain.  10/2: PCCM asked to see.   Consults: date of consult/date signed off & final recs:  Pulm consult 10/2  Procedures (surgical and bedside):    Significant Diagnostic Tests:  CT chest 10/1: massive PE. Right main Pulm artery w/ PE involving bilateral segment total pulmonary arteries of all lobes. RV/LV ration 1.57 ECHO 10/1: EF 55-60%, D-shaped interventricular septum c/w RV pressure/volume overload. Mod dilated RV w/ mod to severe systolic dysfxn. Mild Pulmonary hypertension.  10/1 LE [place]: neg for PE  Micro Data:    Antimicrobials:    Subjective:  Feels better.  No pain or distress  Objective   Blood pressure 135/90, pulse 91, temperature 98.4 F (36.9 C), temperature source Oral, resp. rate 17, height 5\' 5"  (1.651 m), weight 58.7 kg, last menstrual period 01/12/2018, SpO2 97 %.        Intake/Output Summary (Last 24 hours) at 01/29/2018 0946 Last data filed at 01/29/2018 0155 Gross per 24 hour  Intake 120 ml  Output 150 ml  Net -30 ml   Filed Weights   01/26/18 1756 01/26/18 2145  Weight: 61.2 kg 58.7 kg    Examination:  General: This is a 17 year old female patient she is currently resting in bed and in no acute distress HEENT normocephalic atraumatic no jugular venous distention  Pulmonary:  Clear to auscultation no accessory use Cardiac: Regular rate and rhythm without murmur rub or gallop Abdomen: Soft nontender no organomegaly Extremities: Warm dry no edema Neuro: Awake oriented no focal  deficits GU: Voids spontaneously  Resolved Hospital Problem list     Assessment & Plan:  Hypoxia in setting of Submassive bilateral PE w/ intermediate/high risk 30d mortality based on sPesi score w/ + echo, trop findings. Likely provoked by oral contraceptives.  -clinically improving  Plan Continue NOAC, anticipate 59-month therapy course  Avoid birth control  Walking oximetry prior to discharge to determine oxygen needs  We will see her again on the 14th (appointment in computer) at which time we will repeat walking oximetry and plan for follow-up echocardiogram in 6 weeks  Disposition / Summary of Today's Plan 01/29/18   Ready for discharge when determined appropriate by internal medicine service Follow-up made     Diet: regular  Pain/Anxiety/Delirium protocol (if indicated): NA VAP protocol (if indicated): NA DVT prophylaxis: IV heparin transitioning to NOAC GI prophylaxis: NA Hyperglycemia protocol: NA Mobility: as tolerated Code Status: full code Family Communication: updated  Labs   CBC: Recent Labs  Lab 01/26/18 1327 01/27/18 0819 01/28/18 0343 01/29/18 0331  WBC 8.3 6.6 5.4 6.2  HGB 10.0* 9.2* 8.6* 8.4*  HCT 33.5* 30.4* 28.5* 28.2*  MCV 69.9* 69.6* 70.0* 69.6*  PLT 302 279 267 250    Basic Metabolic Panel: Recent Labs  Lab 01/26/18 1327 01/27/18 0819  NA 141 142  K 3.6 3.6  CL 107 109  CO2 21* 19*  GLUCOSE 103* 80  BUN 9 7  CREATININE 0.88 0.84  CALCIUM 9.6 9.3  MG  --  2.1  PHOS  --  3.4   GFR: Estimated Creatinine Clearance: 96.9 mL/min (by C-G formula based on SCr of 0.84 mg/dL). Recent Labs  Lab 01/26/18 1327 01/27/18 0819 01/28/18 0343 01/29/18 0331  WBC 8.3 6.6 5.4 6.2    Liver Function Tests: Recent Labs  Lab 01/27/18 0819  AST 27  ALT 30  ALKPHOS 68  BILITOT 0.8  PROT 7.0  ALBUMIN 3.3*   No results for input(s): LIPASE, AMYLASE in the last 168 hours. No results for input(s): AMMONIA in the last 168 hours.  ABG No  results found for: PHART, PCO2ART, PO2ART, HCO3, TCO2, ACIDBASEDEF, O2SAT   Coagulation Profile: No results for input(s): INR, PROTIME in the last 168 hours.  Cardiac Enzymes: Recent Labs  Lab 01/26/18 2202 01/27/18 0819 01/27/18 1047  TROPONINI 0.12* 0.10* 0.08*    HbA1C: No results found for: HGBA1C  CBG: Recent Labs  Lab 01/26/18 1320  GLUCAP 89     Simonne Martinet ACNP-BC [facility] Pager # [phone] OR # [phone] if no answer

## 2018-01-29 NOTE — Care Management Note (Addendum)
Case Management Note  Patient Details  Name: Mariah Yang MRN: [id number] Date of Birth: 2000-05-22  Subjective/Objective:                  Discharge plANNING=home o2 and for travel  Action/Plan: [facility] /d[facility] notified of need at 0836 unable to provide/tct-linn care-will provide tanks for travel and set up in the home when patient arrives at the home.  Expected Discharge Date:  01/29/18               Expected Discharge Plan:  Home/Self Care  In-House Referral:     Discharge planning Services  CM Consult  Post Acute Care Choice:    Choice offered to:     DME Arranged:    DME Agency:     HH Arranged:    HH Agency:     Status of Service:  In process, will continue to follow  If discussed at Long Length of Stay Meetings, dates discussed:    Additional Comments:  Golda Acre, RN 01/29/2018, 8:38 AM

## 2018-01-29 NOTE — Discharge Summary (Addendum)
Physician Discharge Summary  Mariah Yang JXB:[id number] DOB: 01/14/2001 DOA: 01/26/2018  PCP: System, Pcp Not In  Admit date: 01/26/2018 Discharge date: 02/02/2018  Admitted From: home  Disposition:  home   Recommendations for Outpatient Follow-up:  1. Cont to follow Hb and Iron levels 2. She will need another Iron transfusion (Feraheme) in 1 wk   Discharge Condition:  stable   CODE STATUS:  Full code   Consultations:  PCCM    Discharge Diagnoses:  Principal Problem:   Pulmonary embolus and infarction (HCC) Active Problems:   Syncope   Iron deficiency anemia due to chronic blood loss       Brief Summary: Mariah Yang is a 17 y/o with a h/o anemia who presented for a syncopal episode while in the shower. She was unconscious for about 15 minutes. She has been short of breath on exertion lately and was started on birth control pills about 4 months ago for heavy menses.  CTA: Massive pulmonary embolus in the right main pulmonary artery with pulmonary embolus involving bilateral segment total pulmonary arteries of all lobes. There is evidence of right heart strain. Pulmonary infarcts are identified in bilateral lower lobes.  Hospital Course:  Principal Problem:    Syncope/ Pulmonary embolus and infarction  - right heart strain noted on CT - on Heparin infusion- clinically, BP stable but HR mildly elevated - pulse ox 94% on room air but drops to low 80s on exertion- have set up for home O2 @ 3 L  - cause of PE may be recent start on OCP which has been discontinued  - recommend hypercoagulable work up as outpt - 2 D ECHO noted below shows significant RV strain and a moderate pericardial effusion - LE venous duplex negative for DVT-  - pulmonary consulted  - they do not feel she need catheter directed TPA at this point but she does need close follow up  Active Problems:   Iron deficiency anemia due to chronic blood loss -  given Feraheme on 10/2- she will need a second dose  in 1 wk - low normal B12 as well- will replace via s/c route while in hospital  Menorrhagia -have to d/c's OCP and she will need to f/u with her Gyn for further discussions about plan- unfortunately, due to anticoagulation, her bleeding will likely be substantially increased   Discharge Exam: Vitals:   01/29/18 0800 01/29/18 0900  BP: 135/90 127/78  Pulse: 91 100  Resp: 17 20  Temp: 98.4 F (36.9 C)   SpO2: 97% 94%   Vitals:   01/29/18 0600 01/29/18 0700 01/29/18 0800 01/29/18 0900  BP: 126/82 128/76 135/90 127/78  Pulse: 82 85 91 100  Resp: (!) 21 (!) 22 17 20   Temp:   98.4 F (36.9 C)   TempSrc:   Oral   SpO2: 91% 94% 97% 94%  Weight:      Height:        General: Pt is alert, awake, not in acute distress Cardiovascular: RRR, S1/S2 +, no rubs, no gallops Respiratory: CTA bilaterally, no wheezing, no rhonchi Abdominal: Soft, NT, ND, bowel sounds + Extremities: no edema, no cyanosis   Discharge Instructions  Discharge Instructions    Diet general   Complete by:  As directed    Increase activity slowly   Complete by:  As directed      Allergies as of 01/29/2018      Reactions   Amoxicillin-pot Clavulanate Rash      Medication List  STOP taking these medications   cephALEXin 500 MG capsule Commonly known as:  KEFLEX   Chlorhexidine Gluconate 2 % Liqd   cyclobenzaprine 10 MG tablet Commonly known as:  FLEXERIL   ibuprofen 200 MG tablet Commonly known as:  ADVIL,MOTRIN   TRI-LO-MARZIA 0.18/0.215/0.25 MG-25 MCG tab Generic drug:  Norgestimate-Ethinyl Estradiol Triphasic     TAKE these medications   apixaban 5 MG Tabs tablet Commonly known as:  ELIQUIS Take 2 tablets (10 mg total) by mouth 2 (two) times daily.   apixaban 5 MG Tabs tablet Commonly known as:  ELIQUIS Take 1 tablet (5 mg total) by mouth 2 (two) times daily. Start taking on:  02/04/2018   ferrous sulfate 325 (65 FE) MG EC tablet Take 1 tablet (325 mg total) by mouth 2 (two) times  daily with a meal.   polyethylene glycol packet Commonly known as:  MIRALAX / GLYCOLAX Take 17 g by mouth daily.   PROAIR HFA 108 (90 Base) MCG/ACT inhaler Generic drug:  albuterol Notes to patient:  Resume home regimen    vitamin B-12 1000 MCG tablet Commonly known as:  CYANOCOBALAMIN Take 1 tablet (1,000 mcg total) by mouth daily.      Follow-up Information    Glenford Bayley, NP Follow up on 02/08/2018.   Specialty:  Pulmonary Disease Why:  2pm report at 145pm for check-in  Contact information: [address] [place] [address] [phone]          Allergies  Allergen Reactions  . Amoxicillin-Pot Clavulanate Rash     Procedures/Studies:  Normal LV size with EF 55-60%. D-shaped interventricular septum   consistent with RV pressure/volume overload. Moderately dilated   RV with moderate to severe systolic dysfunction. Mild pulmonary   hypertension. Findings suggestive of hemodynamically significant   pulmonary embolus.  Dg Chest 2 View  Result Date: 01/26/2018 CLINICAL DATA:  Short of breath and syncope. EXAM: CHEST - 2 VIEW COMPARISON:  None. FINDINGS: Mild enlargement of the cardiopericardial silhouette. No mediastinal or hilar masses. No evidence of adenopathy. Clear lungs.  No pleural effusion or pneumothorax. Skeletal structures are unremarkable. IMPRESSION: 1. No acute cardiopulmonary disease. 2. Mild cardiomegaly of unclear etiology in this young patient. Electronically Signed   By: Amie Portland M.D.   On: 01/26/2018 14:44   Ct Head Wo Contrast  Result Date: 01/26/2018 CLINICAL DATA:  Syncope.  Headache. EXAM: CT HEAD WITHOUT CONTRAST TECHNIQUE: Contiguous axial images were obtained from the base of the skull through the vertex without intravenous contrast. COMPARISON:  None. FINDINGS: Brain: No evidence of acute infarction, hemorrhage, hydrocephalus, extra-axial collection or mass lesion/mass effect. Vascular: No hyperdense vessel or  unexpected calcification. Skull: Normal. Negative for fracture or focal lesion. Sinuses/Orbits: No acute finding. Other: None. IMPRESSION: Normal head CT. Electronically Signed   By: Lupita Raider, M.D.   On: 01/26/2018 14:25   Ct Angio Chest Pe W And/or Wo Contrast  Result Date: 01/26/2018 CLINICAL DATA:  Positive D-dimer test. EXAM: CT ANGIOGRAPHY CHEST WITH CONTRAST TECHNIQUE: Multidetector CT imaging of the chest was performed using the standard protocol during bolus administration of intravenous contrast. Multiplanar CT image reconstructions and MIPs were obtained to evaluate the vascular anatomy. CONTRAST:  ISOVUE-370 IOPAMIDOL (ISOVUE-370) INJECTION 76% COMPARISON:  Chest x-ray January 26, 2018 FINDINGS: Cardiovascular: Large pulmonary embolus is identified in the right main pulmonary artery and in the bilateral segmental pulmonary arteries of all lobes. The aorta is normal. The heart size is enlarged. There  is a small pericardial effusion. The right ventricular to left ventricular ratio is 1.57. Mediastinum/Nodes: No enlarged mediastinal, hilar, or axillary lymph nodes. Thyroid gland, trachea, and esophagus demonstrate no significant findings. Lungs/Pleura: There pulmonary infarcts involving the bilateral lower lobes there is no pleural effusion. Upper Abdomen: No acute abnormality. Musculoskeletal: No chest wall abnormality. No acute or significant osseous findings. Review of the MIP images confirms the above findings. IMPRESSION: Massive pulmonary embolus in the right main pulmonary artery with pulmonary embolus involving bilateral segment total pulmonary arteries of all lobes. There is evidence of right heart strain. Pulmonary infarcts are identified in bilateral lower lobes. Cardiomegaly.  Small pericardial effusion. Critical Value/emergent results were called by telephone at the time of interpretation on 01/26/2018 at 5:19 pm to Dr. Claude Manges , who verbally acknowledged these results.  Electronically Signed   By: Sherian Rein M.D.   On: 01/26/2018 17:22     The results of significant diagnostics from this hospitalization (including imaging, microbiology, ancillary and laboratory) are listed below for reference.     Microbiology: Recent Results (from the past 240 hour(s))  Urine Culture     Status: Abnormal   Collection Time: 01/26/18  1:39 PM  Result Value Ref Range Status   Specimen Description   Final    URINE, CLEAN CATCH Performed at [facility], [address] [place], [place] [address]    Special Requests   Final    NONE Performed at [facility], [address], [place], [place] [address]    Culture (A)  Final    <10,000 COLONIES/mL INSIGNIFICANT GROWTH Performed at [facility], [address], [place], [place] [address]    Report Status 01/28/2018 FINAL  Final  MRSA PCR Screening     Status: None   Collection Time: 01/26/18  9:51 PM  Result Value Ref Range Status   MRSA by PCR NEGATIVE NEGATIVE Final    Comment:        The GeneXpert MRSA Assay (FDA approved for NASAL specimens only), is one component of a comprehensive MRSA colonization surveillance program. It is not intended to diagnose MRSA infection nor to guide or monitor treatment for MRSA infections. Performed at [facility], [address] [place], [place] [address]      Labs: BNP (last 3 results) Recent Labs    01/26/18 2202  BNP 541.4*   Basic Metabolic Panel: Recent Labs  Lab 01/27/18 0819  NA 142  K 3.6  CL 109  CO2 19*  GLUCOSE 80  BUN 7  CREATININE 0.84  CALCIUM 9.3  MG 2.1  PHOS 3.4   Liver Function Tests: Recent Labs  Lab 01/27/18 0819  AST 27  ALT 30  ALKPHOS 68  BILITOT 0.8  PROT 7.0  ALBUMIN 3.3*   No results for input(s): LIPASE, AMYLASE in the last 168 hours. No results for input(s): AMMONIA in the last 168 hours. CBC: Recent Labs  Lab 01/27/18 0819 01/28/18 0343  01/29/18 0331  WBC 6.6 5.4 6.2  HGB 9.2* 8.6* 8.4*  HCT 30.4* 28.5* 28.2*  MCV 69.6* 70.0* 69.6*  PLT 279 267 250   Cardiac Enzymes: Recent Labs  Lab 01/26/18 2202 01/27/18 0819 01/27/18 1047  TROPONINI 0.12* 0.10* 0.08*   BNP: Invalid input(s): POCBNP CBG: No results for input(s): GLUCAP in the last 168 hours. D-Dimer No results for input(s): DDIMER in the last 72 hours. Hgb A1c No results for input(s): HGBA1C in the last  72 hours. Lipid Profile No results for input(s): CHOL, HDL, LDLCALC, TRIG, CHOLHDL, LDLDIRECT in the last 72 hours. Thyroid function studies No results for input(s): TSH, T4TOTAL, T3FREE, THYROIDAB in the last 72 hours.  Invalid input(s): FREET3 Anemia work up No results for input(s): VITAMINB12, FOLATE, FERRITIN, TIBC, IRON, RETICCTPCT in the last 72 hours. Urinalysis    Component Value Date/Time   COLORURINE AMBER (A) 01/26/2018 1339   APPEARANCEUR CLOUDY (A) 01/26/2018 1339   LABSPEC 1.025 01/26/2018 1339   PHURINE 6.0 01/26/2018 1339   GLUCOSEU 50 (A) 01/26/2018 1339   HGBUR NEGATIVE 01/26/2018 1339   BILIRUBINUR SMALL (A) 01/26/2018 1339   KETONESUR 5 (A) 01/26/2018 1339   PROTEINUR >=300 (A) 01/26/2018 1339   NITRITE NEGATIVE 01/26/2018 1339   LEUKOCYTESUR NEGATIVE 01/26/2018 1339   Sepsis Labs Invalid input(s): PROCALCITONIN,  WBC,  LACTICIDVEN Microbiology Recent Results (from the past 240 hour(s))  Urine Culture     Status: Abnormal   Collection Time: 01/26/18  1:39 PM  Result Value Ref Range Status   Specimen Description   Final    URINE, CLEAN CATCH Performed at [facility], [address] [place], [place] [address]    Special Requests   Final    NONE Performed at [facility], [address] [place], [place] [address]    Culture (A)  Final    <10,000 COLONIES/mL INSIGNIFICANT GROWTH Performed at [facility], [address] [place], [place] [address]    Report Status  01/28/2018 FINAL  Final  MRSA PCR Screening     Status: None   Collection Time: 01/26/18  9:51 PM  Result Value Ref Range Status   MRSA by PCR NEGATIVE NEGATIVE Final    Comment:        The GeneXpert MRSA Assay (FDA approved for NASAL specimens only), is one component of a comprehensive MRSA colonization surveillance program. It is not intended to diagnose MRSA infection nor to guide or monitor treatment for MRSA infections. Performed at [facility], [address], [place], [place] [address]      Time coordinating discharge in minutes: 60  SIGNED:   Calvert Cantor, MD  [facility] 02/02/2018, 4:55 PM Pager   If 7PM-7AM, please contact night-coverage [email or web address] Password [email or web address]

## 2018-02-08 ENCOUNTER — Ambulatory Visit (INDEPENDENT_AMBULATORY_CARE_PROVIDER_SITE_OTHER): Payer: 59 | Admitting: Primary Care

## 2018-02-08 ENCOUNTER — Other Ambulatory Visit (INDEPENDENT_AMBULATORY_CARE_PROVIDER_SITE_OTHER): Payer: 59

## 2018-02-08 ENCOUNTER — Encounter: Payer: Self-pay | Admitting: Primary Care

## 2018-02-08 VITALS — BP 104/76 | HR 82 | Temp 98.5°F | Ht 64.25 in | Wt 128.4 lb

## 2018-02-08 DIAGNOSIS — I2699 Other pulmonary embolism without acute cor pulmonale: Secondary | ICD-10-CM

## 2018-02-08 DIAGNOSIS — D500 Iron deficiency anemia secondary to blood loss (chronic): Secondary | ICD-10-CM

## 2018-02-08 LAB — IBC PANEL
IRON: 162 ug/dL — AB (ref 42–145)
Saturation Ratios: 33.2 % (ref 20.0–50.0)
TRANSFERRIN: 349 mg/dL (ref 212.0–360.0)

## 2018-02-08 LAB — CBC
HEMATOCRIT: 34.6 % — AB (ref 36.0–49.0)
Hemoglobin: 10.6 g/dL — ABNORMAL LOW (ref 12.0–16.0)
MCHC: 30.7 g/dL — ABNORMAL LOW (ref 31.0–37.0)
MCV: 75.2 fl — ABNORMAL LOW (ref 78.0–98.0)
PLATELETS: 349 10*3/uL (ref 150.0–575.0)
RBC: 4.6 Mil/uL (ref 3.80–5.70)
RDW: 27.3 % — ABNORMAL HIGH (ref 11.4–15.5)
WBC: 5 10*3/uL (ref 4.5–13.5)

## 2018-02-08 NOTE — Progress Notes (Signed)
@Patient  ID: Mariah Yang, female    DOB: 13-Feb-2001, 17 y.o.   MRN: [id number]  Chief Complaint  Patient presents with  . Follow-up    used oxygen until last night    Referring provider: No ref. provider found  HPI: 17 year old female, never smoked. PMH anemia. New dx bilateral pulmonary emboli requiring hospitalization, seen for consult by Dr. Tonia Brooms in-patient. On oral birth control x4 months for heavy menses.   Hospital course 10/1-10/4: Patient presented with progressive sob and back pain for 3 weeks. She had a syncopal episode in the shower where she lost consciousness for 15 mins. CTA showed submassive pulmonary embolus in the right main pulmonary artery, pulmonary infarcts bilateral lower lobes. Echo showed right heart strain and moderate pericardial effusion. Started on IV heparin. Pulmonary consulted and did not recommend TPA. LE venous duplex negative for DVT. Transitioned to Eliquis 10mg  BID until 10/8, then 5mg  twice daily. Discharged with home oxygen. Will need repeat ECHO in 3 months. Discontinue OCP, will need follow-up with GYN for further plan.    02/08/2018  Patient presents today for hospital follow-up PE. Accompanied by her parents. 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 She needs to walk approx 1/2 mile to class. She feels well today, denies chest pain or shortness of breath. Not currently using oxygen during the day, used last night. Has pulse ox at home. Needs repeat hgb and iron levels today. Did not have second iron transfusion. ANA was negative, lupus anticoagulant panel was mildly elevated, however, patient was already on anticoagulation when labs were drawn. Will need to recheck 2 weeks after completion of anticoagulation. No joint pain or rash.    Allergies  Allergen Reactions  . Amoxicillin-Pot Clavulanate Rash    Immunization History  Administered Date(s) Administered  . Influenza,inj,Quad PF,6+ Mos 01/29/2018    History  reviewed. No pertinent past medical history.  Tobacco History: Social History   Tobacco Use  Smoking Status Never Smoker  Smokeless Tobacco Never Used   Counseling given: Not Answered   Outpatient Medications Prior to Visit  Medication Sig Dispense Refill  . apixaban (ELIQUIS) 5 MG TABS tablet Take 1 tablet (5 mg total) by mouth 2 (two) times daily. 60 tablet 0  . ferrous sulfate 325 (65 FE) MG EC tablet Take 1 tablet (325 mg total) by mouth 2 (two) times daily with a meal. 60 tablet 3  . polyethylene glycol (MIRALAX / GLYCOLAX) packet Take 17 g by mouth daily. 14 each 0  . PROAIR HFA 108 (90 Base) MCG/ACT inhaler     . vitamin B-12 (CYANOCOBALAMIN) 1000 MCG tablet Take 1 tablet (1,000 mcg total) by mouth daily. 30 tablet 0  . apixaban (ELIQUIS) 5 MG TABS tablet Take 2 tablets (10 mg total) by mouth 2 (two) times daily. 12 tablet 0   No facility-administered medications prior to visit.     Review of Systems  Review of Systems  Constitutional: Negative.   HENT: Negative.   Respiratory: Positive for cough, shortness of breath and wheezing.   Cardiovascular: Negative.     Physical Exam  BP 104/76 (BP Location: Right Arm, Cuff Size: Normal)   Pulse 82   Temp 98.5 F (36.9 C)   Ht 5' 4.25" (1.632 m)   Wt 128 lb 6.4 oz (58.2 kg)   LMP 01/12/2018   SpO2 99%   BMI 21.87 kg/m  Physical Exam  Constitutional: She is oriented to person, place, and  time. She appears well-developed and well-nourished.  HENT:  Head: Normocephalic and atraumatic.  Eyes: Pupils are equal, round, and reactive to light. EOM are normal.  Neck: Normal range of motion. Neck supple.  Cardiovascular: Normal rate, regular rhythm and normal heart sounds.  No murmur heard. Pulmonary/Chest: Effort normal and breath sounds normal. No respiratory distress. She has no wheezes.  Abdominal: Soft. Bowel sounds are normal. There is no tenderness.  Neurological: She is alert and oriented to person, place, and  time.  Skin: Skin is warm and dry. No rash noted. No erythema.  Psychiatric: She has a normal mood and affect. Her behavior is normal. Judgment normal.     Lab Results:  CBC    Component Value Date/Time   WBC 5.0 02/08/2018 1524   RBC 4.60 02/08/2018 1524   HGB 10.6 (L) 02/08/2018 1524   HCT 34.6 (L) 02/08/2018 1524   PLT 349.0 02/08/2018 1524   MCV 75.2 (L) 02/08/2018 1524   MCH 20.7 (L) 01/29/2018 0331   MCHC 30.7 (L) 02/08/2018 1524   RDW 27.3 (H) 02/08/2018 1524    BMET    Component Value Date/Time   NA 142 01/27/2018 0819   K 3.6 01/27/2018 0819   CL 109 01/27/2018 0819   CO2 19 (L) 01/27/2018 0819   GLUCOSE 80 01/27/2018 0819   BUN 7 01/27/2018 0819   CREATININE 0.84 01/27/2018 0819   CALCIUM 9.3 01/27/2018 0819   GFRNONAA >60 01/27/2018 0819   GFRAA >60 01/27/2018 0819    BNP    Component Value Date/Time   BNP 541.4 (H) 01/26/2018 2202    ProBNP No results found for: PROBNP  Imaging: Dg Chest 2 View  Result Date: 01/26/2018 CLINICAL DATA:  Short of breath and syncope. EXAM: CHEST - 2 VIEW COMPARISON:  None. FINDINGS: Mild enlargement of the cardiopericardial silhouette. No mediastinal or hilar masses. No evidence of adenopathy. Clear lungs.  No pleural effusion or pneumothorax. Skeletal structures are unremarkable. IMPRESSION: 1. No acute cardiopulmonary disease. 2. Mild cardiomegaly of unclear etiology in this young patient. Electronically Signed   By: Amie Portland M.D.   On: 01/26/2018 14:44   Ct Head Wo Contrast  Result Date: 01/26/2018 CLINICAL DATA:  Syncope.  Headache. EXAM: CT HEAD WITHOUT CONTRAST TECHNIQUE: Contiguous axial images were obtained from the base of the skull through the vertex without intravenous contrast. COMPARISON:  None. FINDINGS: Brain: No evidence of acute infarction, hemorrhage, hydrocephalus, extra-axial collection or mass lesion/mass effect. Vascular: No hyperdense vessel or unexpected calcification. Skull: Normal. Negative  for fracture or focal lesion. Sinuses/Orbits: No acute finding. Other: None. IMPRESSION: Normal head CT. Electronically Signed   By: Lupita Raider, M.D.   On: 01/26/2018 14:25   Ct Angio Chest Pe W And/or Wo Contrast  Result Date: 01/26/2018 CLINICAL DATA:  Positive D-dimer test. EXAM: CT ANGIOGRAPHY CHEST WITH CONTRAST TECHNIQUE: Multidetector CT imaging of the chest was performed using the standard protocol during bolus administration of intravenous contrast. Multiplanar CT image reconstructions and MIPs were obtained to evaluate the vascular anatomy. CONTRAST:  ISOVUE-370 IOPAMIDOL (ISOVUE-370) INJECTION 76% COMPARISON:  Chest x-ray January 26, 2018 FINDINGS: Cardiovascular: Large pulmonary embolus is identified in the right main pulmonary artery and in the bilateral segmental pulmonary arteries of all lobes. The aorta is normal. The heart size is enlarged. There is a small pericardial effusion. The right ventricular to left ventricular ratio is 1.57. Mediastinum/Nodes: No enlarged mediastinal, hilar, or axillary lymph nodes. Thyroid gland, trachea, and esophagus  demonstrate no significant findings. Lungs/Pleura: There pulmonary infarcts involving the bilateral lower lobes there is no pleural effusion. Upper Abdomen: No acute abnormality. Musculoskeletal: No chest wall abnormality. No acute or significant osseous findings. Review of the MIP images confirms the above findings. IMPRESSION: Massive pulmonary embolus in the right main pulmonary artery with pulmonary embolus involving bilateral segment total pulmonary arteries of all lobes. There is evidence of right heart strain. Pulmonary infarcts are identified in bilateral lower lobes. Cardiomegaly.  Small pericardial effusion. Critical Value/emergent results were called by telephone at the time of interpretation on 01/26/2018 at 5:19 pm to Dr. Claude Manges , who verbally acknowledged these results. Electronically Signed   By: Sherian Rein M.D.   On:  01/26/2018 17:22     Assessment & Plan:    Pulmonary embolus and infarction (HCC) - Provoked PE; oral birth control x4 months, non-smoker  - Currently on Eliquis 5mg  BID  - Needs ECHO in 3 months RE: PE and right heart strain  - Amb O2 sat 88%, needs to wear oxygen with walking/acitivty - Check ONO - FU in 2 weeks with NP to recheck amb O2 sat - Schedule apt in 3 months with Dr. Tonia Brooms to review ECHO results and discuss duration of anticoagulation  - OCP discontinued, needs follow-up with GYN. Discussed use of back up method until then   Iron deficiency anemia due to chronic blood loss - HGB improved, 10.6 (8.4) - Iron 162 (15), transferrin 349  - Second iron transfusion not needed currently    Glenford Bayley, NP 02/08/2018

## 2018-02-08 NOTE — Assessment & Plan Note (Addendum)
-   Provoked PE; oral birth control x4 months, non-smoker  - Currently on Eliquis 5mg  BID  - Needs ECHO in 3 months RE: PE and right heart strain  - Amb O2 sat 88%, needs to wear oxygen with walking/activity - Check ONO - FU in 2 weeks with NP to recheck amb O2 sat - Schedule apt in 3 months with Dr. Tonia Brooms to review ECHO results and discuss duration of anticoagulation  - OCP discontinued, needs follow-up with GYN. Discussed use of back up method until then

## 2018-02-08 NOTE — Patient Instructions (Addendum)
Labs today  Recommendations: Continue Eliquis twice daily until stopped by provider  (May need for 6-12 months, will determine longevity of treatment at next follow-up)  Needs to wear oxygen when walking to class or with activity  Orders: 2D Echocardiogram Re: PE  Follow-up  2-4 weeks with Waynetta Sandy, NP  Dr. Tonia Brooms in 3 months  Please follow up with GYN regarding birth control, use back up method   Out of school until 10/28    Pulmonary Embolism A pulmonary embolism (PE) is a sudden blockage or decrease of blood flow in one lung or both lungs. Most blockages come from a blood clot that forms in a lower leg, thigh, or arm vein (deep vein thrombosis, DVT) and travels to the lungs. A clot is blood that has thickened into a gel or solid. PE is a dangerous and life-threatening condition that needs to be treated right away. What are the causes? This condition is usually caused by a blood clot that forms in a vein and moves to the lungs. In rare cases, it may be caused by air, fat, part of a tumor, or other tissue that moves through the veins and into the lungs. What increases the risk? The following factors may make you more likely to develop this condition:  Having DVT or a history of DVT.  Being older than age 79.  Personal or family history of blood clots or blood clotting disease.  Major or lengthy surgery.  Orthopedic surgery, especially hip or knee replacement.  Traumatic injury, such as breaking a hip or leg.  Spinal cord injury.  Stroke.  Taking medicines that contain estrogen. These include birth control pills and hormone replacement therapy.  Long-term (chronic) lung or heart disease.  Cancer and chemotherapy.  Having a central venous catheter.  Pregnancy and the period after delivery.  What are the signs or symptoms? Symptoms of this condition usually start suddenly and include:  Shortness of breath while active or at rest.  Coughing or coughing up blood or  blood-tinged mucus.  Chest pain that is often worse with deep breaths.  Rapid or irregular heartbeat.  Feeling light-headed or dizzy.  Fainting.  Feeling anxious.  Sweating.  Pain and swelling in a leg. This is a symptom of DVT, which can lead to PE.  How is this diagnosed? This condition may be diagnosed based on:  Your medical history.  A physical exam.  Blood tests to check blood oxygen level and how well your blood clots, and a D-dimer blood test, which checks your blood for a substance that is released when a blood clot breaks apart.  CT pulmonary angiogram. This test checks blood flow in and around your lungs.  Ventilation-perfusion scan, also called a lung VQ scan. This test measures air flow and blood flow to the lungs.  Ultrasound of the legs to look for blood clots.  How is this treated? Treatment for this conditions depends on many factors, such as the cause of your PE, your risk for bleeding or developing more clots, and other medical conditions you have. Treatment aims to remove, dissolve, or stop blood clots from forming or growing larger. Treatment may include:  Blood thinning medicines (anticoagulants) to stop clots from forming or growing. These medicines may be given as a pill, as an injection, or through an IV tube (infusion).  Medicines that dissolve clots (thrombolytics).  A procedure in which a flexible tube is used to remove a blood clot (embolectomy) or deliver medicine to destroy it (  catheter-directed thrombolysis).  A procedure in which a filter is inserted into a large vein that carries blood to the heart (inferior vena cava). This filter (vena cava filter) catches blood clots before they reach the lungs.  Surgery to remove the clot (surgical embolectomy). This is rare.  You may need a combination of immediate, long-term (up to 3 months after diagnosis), and extended (more than 3 months after diagnosis) treatments. Your treatment may continue for  several months (maintenance therapy). You and your health care provider will work together to choose the treatment program that is best for you. Follow these instructions at home: If you are taking an anticoagulant medicine:  Take the medicine every day at the same time each day.  Understand what foods and drugs interact with your medicine.  Understand the side effects of this medicine, including excessive bruising or bleeding. Ask your health care provider or pharmacist about other side effects. General instructions  Take over-the-counter and prescription medicines only as told by your health care provider.  Anticoagulant medicines may cause side effects, including easy bruising and difficulty stopping bleeding. If you are prescribed an anticoagulant: ? Hold pressure over cuts for longer than usual. ? Tell your dentist and other health care providers that you are taking anticoagulants before you have any procedure that may cause bleeding. ? Avoid contact sports. ? Be extra careful when handling sharp objects. ? Use a soft toothbrush. Floss with waxed dental floss. ? Shave with an Neurosurgeon.  Wear a medical alert bracelet or carry a medical alert card that says you have had a PE.  Ask your health care provider when you may return to your normal activities.  Talk with your health care provider about any travel plans. It is important to make sure that you are still able to take your medicine while on trips.  Keep all follow-up visits as told by your health care provider. This is important. How is this prevented? Take these actions to lower your risk of developing another PE:  Exercise regularly. Take frequent walks. For at least 30 minutes every day, engage in: ? Activity that involves moving your arms and legs. ? Activity that encourages good blood flow through your body by increasing your heart rate.  While traveling, drink plenty of water and avoid drinking alcohol. Ask your  health care provider if you should wear below-the-knee compression stockings.  Avoid sitting or lying in bed for long periods of time without moving your legs. Exercise your arms and legs every hour during long-distance travel (over 4 hours).  If you are hospitalized or have surgery, ask your health care provider about your risks and what treatments can help prevent blood clots.  Maintain a healthy weight. Ask your health care provider what weight is healthy for you.  If you are a woman who is over age 38, avoid unnecessary use of medicines that contain estrogen, including birth control pills.  Do not use any products that contain nicotine or tobacco, such as cigarettes and e-cigarettes. This is especially important if you take estrogen medicines. If you need help quitting, ask your health care provider.  See your health care provider for regular checkups. This may include blood tests and ultrasound testing on your legs to check for new blood clots.  Contact a health care provider if:  You missed a dose of your blood thinner medicine. Get help right away if:  You have new or increased pain, swelling, warmth, or redness in an arm or  leg.  You have numbness or tingling in an arm or leg.  You have shortness of breath while active or at rest.  You have chest pain.  You have a rapid or irregular heartbeat.  You feel light-headed or dizzy.  You cough up blood.  You have blood in your vomit, stool, or urine.  You have a fever.  You have abdomen (abdominal) pain.  You have a severe fall or head injury.  You have a severe headache.  You have vision changes.  You cannot move your arms or legs.  You are confused or have memory loss.  You are bleeding for 10 minutes or more, even with strong pressure on the wound. These symptoms may represent a serious problem that is an emergency. Do not wait to see if the symptoms will go away. Get medical help right away. Call your local  emergency services (911 in the [place] Do not drive yourself to the hospital. Summary  A pulmonary embolism (PE) is a sudden blockage or decrease of blood flow in one lung or both lungs. PE is a dangerous and life-threatening condition that needs to be treated right away.  Having deep vein thrombosis (DVT) or a history of DVT is the most common risk factor for PE.  Treatments for this condition usually include medicines to thin your blood (anticoagulants) or medicines to break apart blood clots (thrombolytics).  If you are prescribed blood thinners, it is important to take the medicine every single day at the same time each day.  If you have signs of PE or DVT, call your local emergency services (911 in the [place] This information is not intended to replace advice given to you by your health care provider. Make sure you discuss any questions you have with your health care provider. Document Released: 04/11/2000 Document Revised: 05/17/2016 Document Reviewed: 05/17/2016 Elsevier Interactive Patient Education  2018 [employer].

## 2018-02-08 NOTE — Assessment & Plan Note (Signed)
-   HGB improved, 10.6 (8.4) - Iron 162 (15), transferrin 349  - Second iron transfusion not needed currently

## 2018-02-09 NOTE — Progress Notes (Signed)
Agree, thanks for seeing her.   Mariah Igo, DO [facility] 02/09/2018 7:32 AM

## 2018-02-15 ENCOUNTER — Telehealth: Payer: Self-pay | Admitting: Primary Care

## 2018-02-15 ENCOUNTER — Other Ambulatory Visit (HOSPITAL_COMMUNITY): Payer: 59

## 2018-02-15 DIAGNOSIS — I2699 Other pulmonary embolism without acute cor pulmonale: Secondary | ICD-10-CM

## 2018-02-15 NOTE — Telephone Encounter (Signed)
Sounds fine, can we please just make sure we get the results sent to [place] and their contact info. Thanks

## 2018-02-15 NOTE — Telephone Encounter (Signed)
Spoke with patient. She is aware of Beth's response. Will place order and will fax to the number given. Nothing further needed at time of call.

## 2018-02-15 NOTE — Telephone Encounter (Signed)
Saw that at pt's last OV with Buelah Manis, an order was placed for pt to have an echo performed and the location for the test to be performed was [facility] off of [address].  Pt was supposed to have had the echo performed today, 10/21 at 8:30 am   Called and spoke with pt who stated she was currently in [place] and while she was there, she was going to get the echo performed in [place].  Beth, please advise if it is okay for [place] to place another order for the echocardiogram so pt can have it performed in [place]. Pt also states she needs the referral faxed to Dr. Dorinda Hill in [place] to [phone]

## 2018-02-22 ENCOUNTER — Encounter: Payer: Self-pay | Admitting: Primary Care

## 2018-02-22 ENCOUNTER — Ambulatory Visit (INDEPENDENT_AMBULATORY_CARE_PROVIDER_SITE_OTHER): Payer: 59 | Admitting: Primary Care

## 2018-02-22 DIAGNOSIS — I2699 Other pulmonary embolism without acute cor pulmonale: Secondary | ICD-10-CM

## 2018-02-22 MED ORDER — APIXABAN 5 MG PO TABS: 5.0000 mg | ORAL_TABLET | Freq: Two times a day (BID) | ORAL | 6 refills | Status: DC

## 2018-02-22 NOTE — Assessment & Plan Note (Addendum)
-   Feels well, no shortness of breath. Mild dull back pain that goes away.  - Ambulatory O2 sat today low 96% on room air - Okay to return to class - Had ECHO in [place], awaiting for results to be faxed (unfortunately this was earlier than recommended)  - Continue Eliquis 5 mg twice daily (do not stop unless provider specifically tell you to) Refill sent - Next follow-up January 14 with Dr. Tonia Brooms - Recommend patient follow-up with GYN to discuss birth control, advised back-up method if sexually active - Return/call if experiencing chest or back pain that does not go away, worsening shortness of breath

## 2018-02-22 NOTE — Patient Instructions (Addendum)
  Oxygen level low 96% RA   Ok to return to school, GOOD luck!!! Wishing you all the best   Continue Eliquis 5mg  twice daily (do not stop taking unless provider specially tells you to) Refill sent   Please follow-up with GYN regarding future BC plans and please use back up method if sexually active  Next apt Jan 14th at 10:30 with Dr. Tonia Brooms  Or sooner if develops chest/back pain that does not go away, becomes more short of breath   Call or return if you have any questions

## 2018-02-22 NOTE — Progress Notes (Signed)
PCCM: Agree. Thanks for seeing her. Josephine Igo, DO [facility] 02/22/2018 10:46 AM

## 2018-02-22 NOTE — Progress Notes (Signed)
@Patient  ID: Mariah Yang, female    DOB: [birth year], 17 y.o.   MRN: [id number]  Chief Complaint  Patient presents with  . Follow-up    no problems since last visit    Referring provider: No ref. provider found  HPI: 17 year old female, never smoked. PMH anemia. New dx bilateral pulmonary emboli requiring hospitalization, seen for consult by Dr. Tonia Brooms in-patient. On oral birth control x4 months for heavy menses.   Hospital course 10/1-10/4: Patient presented with progressive sob and back pain for 3 weeks. She had a syncopal episode in the shower where she lost consciousness for 15 mins. CTA showed submassive pulmonary embolus in the right main pulmonary artery, pulmonary infarcts bilateral lower lobes. Echo showed right heart strain and moderate pericardial effusion. Started on IV heparin. Pulmonary consulted and did not recommend TPA. LE venous duplex negative for DVT. Transitioned to Eliquis 10mg  BID until 10/8, then 5mg  twice daily. Discharged with home oxygen. Will need repeat ECHO in 3 months. Discontinue OCP, will need follow-up with GYN for further plan.    02/08/2018  Patient presents today for hospital follow-up PE. Accompanied by her parents. 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 She needs to walk approx 1/2 mile to class. She feels well today, denies chest pain or shortness of breath. Not currently using oxygen during the day, used last night. Has pulse ox at home. Needs repeat hgb and iron levels today. Did not have second iron transfusion. ANA was negative, lupus anticoagulant panel was mildly elevated, however, patient was already on anticoagulation when labs were drawn. Will need to recheck 2 weeks after completion of anticoagulation. No joint pain or rash.   02/22/2018 Patient presents today for 2 week follow-up. Accompanied by her father. Had Echo in [place], awaiting for results to be faxed. Needs to check ambulatory O2 sat today. Feels well.  Not experiencing shortness of breath. Mild back pain, not sharp and goes away. Not requiring oxygen. Walking around her house and takes her dog outside. Continues Eliquis 1 tab twice a day. Returning to school tomorrow. She had to withdraw from one class. Able to say in 4 class (2 online). She has not seen GYN yet, recommend she make an apt and use back up method if sexually active. Patient understands and states agreement. Amb O2 sat today, low 96% after 3 laps. Denies sob, cough or hemoptysis.  Allergies  Allergen Reactions  . Amoxicillin-Pot Clavulanate Rash    Immunization History  Administered Date(s) Administered  . Influenza,inj,Quad PF,6+ Mos 01/29/2018    History reviewed. No pertinent past medical history.  Tobacco History: Social History   Tobacco Use  Smoking Status Never Smoker  Smokeless Tobacco Never Used   Counseling given: Not Answered   Outpatient Medications Prior to Visit  Medication Sig Dispense Refill  . ferrous sulfate 325 (65 FE) MG EC tablet Take 1 tablet (325 mg total) by mouth 2 (two) times daily with a meal. 60 tablet 3  . polyethylene glycol (MIRALAX / GLYCOLAX) packet Take 17 g by mouth daily. 14 each 0  . PROAIR HFA 108 (90 Base) MCG/ACT inhaler     . vitamin B-12 (CYANOCOBALAMIN) 1000 MCG tablet Take 1 tablet (1,000 mcg total) by mouth daily. 30 tablet 0  . apixaban (ELIQUIS) 5 MG TABS tablet Take 1 tablet (5 mg total) by mouth 2 (two) times daily. 60 tablet 0   No facility-administered medications prior to visit.  Review of Systems  Review of Systems  Constitutional: Negative.   HENT: Negative.   Respiratory: Negative.   Cardiovascular: Negative.   Musculoskeletal: Positive for back pain.  Skin: Negative.   Psychiatric/Behavioral: Negative.     Physical Exam  BP 110/78 (BP Location: Left Arm, Cuff Size: Normal)   Pulse 74   Temp (!) 97.4 F (36.3 C)   Ht 5' 4.25" (1.632 m)   Wt 130 lb 3.2 oz (59.1 kg)   SpO2 100%   BMI 22.18  kg/m  Physical Exam  Constitutional: She is oriented to person, place, and time. She appears well-developed and well-nourished.  HENT:  Head: Normocephalic and atraumatic.  Eyes: Pupils are equal, round, and reactive to light. EOM are normal.  Neck: Normal range of motion. Neck supple.  Cardiovascular: Normal rate, regular rhythm and normal heart sounds.  No murmur heard. Pulmonary/Chest: Effort normal and breath sounds normal. No respiratory distress. She has no wheezes.  Abdominal: Soft. Bowel sounds are normal. There is no tenderness.  Neurological: She is alert and oriented to person, place, and time.  Skin: Skin is warm and dry. No rash noted. No erythema.  Psychiatric: She has a normal mood and affect. Her behavior is normal. Judgment normal.     Lab Results:  CBC    Component Value Date/Time   WBC 5.0 02/08/2018 1524   RBC 4.60 02/08/2018 1524   HGB 10.6 (L) 02/08/2018 1524   HCT 34.6 (L) 02/08/2018 1524   PLT 349.0 02/08/2018 1524   MCV 75.2 (L) 02/08/2018 1524   MCH 20.7 (L) 01/29/2018 0331   MCHC 30.7 (L) 02/08/2018 1524   RDW 27.3 (H) 02/08/2018 1524    BMET    Component Value Date/Time   NA 142 01/27/2018 0819   K 3.6 01/27/2018 0819   CL 109 01/27/2018 0819   CO2 19 (L) 01/27/2018 0819   GLUCOSE 80 01/27/2018 0819   BUN 7 01/27/2018 0819   CREATININE 0.84 01/27/2018 0819   CALCIUM 9.3 01/27/2018 0819   GFRNONAA >60 01/27/2018 0819   GFRAA >60 01/27/2018 0819    BNP    Component Value Date/Time   BNP 541.4 (H) 01/26/2018 2202    ProBNP No results found for: PROBNP  Imaging: Dg Chest 2 View  Result Date: 01/26/2018 CLINICAL DATA:  Short of breath and syncope. EXAM: CHEST - 2 VIEW COMPARISON:  None. FINDINGS: Mild enlargement of the cardiopericardial silhouette. No mediastinal or hilar masses. No evidence of adenopathy. Clear lungs.  No pleural effusion or pneumothorax. Skeletal structures are unremarkable. IMPRESSION: 1. No acute cardiopulmonary  disease. 2. Mild cardiomegaly of unclear etiology in this young patient. Electronically Signed   By: Amie Portland M.D.   On: 01/26/2018 14:44   Ct Head Wo Contrast  Result Date: 01/26/2018 CLINICAL DATA:  Syncope.  Headache. EXAM: CT HEAD WITHOUT CONTRAST TECHNIQUE: Contiguous axial images were obtained from the base of the skull through the vertex without intravenous contrast. COMPARISON:  None. FINDINGS: Brain: No evidence of acute infarction, hemorrhage, hydrocephalus, extra-axial collection or mass lesion/mass effect. Vascular: No hyperdense vessel or unexpected calcification. Skull: Normal. Negative for fracture or focal lesion. Sinuses/Orbits: No acute finding. Other: None. IMPRESSION: Normal head CT. Electronically Signed   By: Lupita Raider, M.D.   On: 01/26/2018 14:25   Ct Angio Chest Pe W And/or Wo Contrast  Result Date: 01/26/2018 CLINICAL DATA:  Positive D-dimer test. EXAM: CT ANGIOGRAPHY CHEST WITH CONTRAST TECHNIQUE: Multidetector CT imaging of the chest  was performed using the standard protocol during bolus administration of intravenous contrast. Multiplanar CT image reconstructions and MIPs were obtained to evaluate the vascular anatomy. CONTRAST:  ISOVUE-370 IOPAMIDOL (ISOVUE-370) INJECTION 76% COMPARISON:  Chest x-ray January 26, 2018 FINDINGS: Cardiovascular: Large pulmonary embolus is identified in the right main pulmonary artery and in the bilateral segmental pulmonary arteries of all lobes. The aorta is normal. The heart size is enlarged. There is a small pericardial effusion. The right ventricular to left ventricular ratio is 1.57. Mediastinum/Nodes: No enlarged mediastinal, hilar, or axillary lymph nodes. Thyroid gland, trachea, and esophagus demonstrate no significant findings. Lungs/Pleura: There pulmonary infarcts involving the bilateral lower lobes there is no pleural effusion. Upper Abdomen: No acute abnormality. Musculoskeletal: No chest wall abnormality. No acute or  significant osseous findings. Review of the MIP images confirms the above findings. IMPRESSION: Massive pulmonary embolus in the right main pulmonary artery with pulmonary embolus involving bilateral segment total pulmonary arteries of all lobes. There is evidence of right heart strain. Pulmonary infarcts are identified in bilateral lower lobes. Cardiomegaly.  Small pericardial effusion. Critical Value/emergent results were called by telephone at the time of interpretation on 01/26/2018 at 5:19 pm to Dr. Claude Manges , who verbally acknowledged these results. Electronically Signed   By: Sherian Rein M.D.   On: 01/26/2018 17:22     Assessment & Plan:   Pulmonary embolus and infarction (HCC) - Feels well, no shortness of breath. Mild dull back pain that goes away.  - Ambulatory O2 sat today low 96% on room air - Okay to return to class - Had ECHO in [place], awaiting for results to be faxed (unfortunately this was earlier than recommended)  - Continue Eliquis 5 mg twice daily (do not stop unless provider specifically tell you to) Refill sent - Next follow-up January 14 with Dr. Tonia Brooms - Recommend patient follow-up with GYN to discuss birth control, advised back-up method if sexually active - Return/call if experiencing chest or back pain that does not go away, worsening shortness of breath   Glenford Bayley, NP 02/22/2018

## 2018-03-19 ENCOUNTER — Telehealth: Payer: Self-pay | Admitting: Primary Care

## 2018-03-19 NOTE — Telephone Encounter (Signed)
Normal ONO 02/11/18  High SPO2 99% Low SPO2 89% Basal SPO2 97.7% Consecutive time less than 88% 0 minutes

## 2018-03-23 NOTE — ED Provider Notes (Addendum)
ED Provider Notes  Attested  Date of Service:  01/29/2018 11:40 AM          Attested      Attestation signed by Cathren Laine, MD at 03/23/2018 6:11 PM  Medical screening examination/treatment/procedure(s) were conducted as a shared visit with non-physician practitioner(s) and myself.  I personally evaluated the patient during the encounter.  EKG Interpretation  Date/Time:                  Tuesday January 26 2018 13:13:20 EDT Ventricular Rate:         107 PR Interval:                   QRS Duration: 100 QT Interval:                 385 QTC Calculation:        514 R Axis:                         36 Text Interpretation:       Sinus tachycardia Nonspecific T wave abnormality No previous tracing Confirmed by Cathren Laine ([id number]) on 01/26/2018 2:11:30 PM   Pt with recent syncopal event, current awake and alert. Is tachycardic. Breathing comfortably currently. No active chest pain. Labs and cta ordered.                                 [facility]ICU[facility] Provider Note   CSN: [id number] Arrival date & time: 01/26/18  1256     History   Chief Complaint Chief Complaint  Patient presents with  . Loss of Consciousness    HPI Mariah Yang is a 17 y.o. female.  17 y/o female with a a PMH of IDA presents to the ED s/p syncopal episode in the shower at her door x an our ago.Patient reports she does not recall the incident but remembers taking a shower and then waking up to her roommates looking at her. She states she's been feeling more winding walking to class lately. She also reports decrease in appetite and eating less than usual this past couple of weeks. Patient [facility] and provided with an inhaler for her shortness of breath as they thought this was more likely to be from allergies. Patient reports she was started on birth control 3 months ago. She reports sitting daily for 8+ during class. No  recent extended travel. She denies any previous history of blood clots or smoking history. Patient denies any headache, chest pain or abdominal pain, or fever.      History reviewed. No pertinent past medical history.  Patient Active Problem List   Diagnosis Date Noted  . Iron deficiency anemia due to chronic blood loss 01/27/2018  . 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 Syncope 01/26/2018    History reviewed. No pertinent surgical history.   OB History   No obstetric history on file.      Home Medications    Prior to Admission medications   Medication Sig Start Date End Date Taking? Authorizing Provider  PROAIR HFA 108 [phone] Base) MCG/ACT inhaler  01/06/18  Yes [provider]  apixaban (ELIQUIS) 5 MG TABS tablet Take 1 tablet (5 mg total) by mouth 2 (two) times daily. 02/22/18   Glenford Bayley, NP  ferrous sulfate 325 (65 FE)  MG EC tablet Take 1 tablet (325 mg total) by mouth 2 (two) times daily with a meal. 01/29/18 01/29/19  Calvert Cantorizwan, Saima, MD  polyethylene glycol (MIRALAX / GLYCOLAX) packet Take 17 g by mouth daily. 01/29/18   Calvert Cantorizwan, Saima, MD  vitamin B-12 (CYANOCOBALAMIN) 1000 MCG tablet Take 1 tablet (1,000 mcg total) by mouth daily. 01/29/18   Calvert Cantorizwan, Saima, MD    Family History Family History  Problem Relation Age of Onset  . Healthy Mother   . Healthy Father   . Diabetes Other   . Cancer Neg Hx   . Stroke Neg Hx   . CAD Neg Hx   . Clotting disorder Neg Hx   . Lupus Neg Hx   . Kidney disease Neg Hx     Social History Social History   Tobacco Use  . Smoking status: Never Smoker  . Smokeless tobacco: Never Used  Substance Use Topics  . Alcohol use: Never    Frequency: Never  . Drug use: Never     Allergies   Amoxicillin-pot clavulanate   Review of Systems Review of Systems  Constitutional: Negative for fever.  Respiratory: Positive for shortness of breath.   Cardiovascular: Negative for chest pain.  Gastrointestinal:  Negative for abdominal pain, nausea and vomiting.  Genitourinary: Negative for flank pain.  Musculoskeletal: Negative for back pain.  Neurological: Positive for syncope and light-headedness. Negative for headaches.     Physical Exam Updated Vital Signs BP 127/78   Pulse 100   Temp 98.4 F (36.9 C) (Oral)   Resp 20   Ht 5\' 5"  (1.651 m)   Wt 58.7 kg   LMP 01/12/2018   SpO2 94%   BMI 21.53 kg/m   Physical Exam  Constitutional: She is oriented to person, place, and time. She appears well-developed and well-nourished. No distress.  HENT:  Head: Normocephalic and atraumatic.  Mouth/Throat: Oropharynx is clear and moist. No oropharyngeal exudate.  Eyes: Pupils are equal, round, and reactive to light.  Neck: Normal range of motion.  Cardiovascular: Regular rhythm and normal heart sounds. Tachycardia present.  Pulmonary/Chest: Accessory muscle usage present. No respiratory distress. She has decreased breath sounds.  Abdominal: Soft. Bowel sounds are normal. She exhibits no distension. There is no tenderness.  Musculoskeletal: She exhibits no tenderness or deformity.       Right lower leg: She exhibits no edema.       Left lower leg: She exhibits no edema.  Neurological: She is alert and oriented to person, place, and time.  Skin: Skin is warm and dry.  Psychiatric: She has a normal mood and affect.  Nursing note and vitals reviewed.    ED Treatments / Results  Labs (all labs ordered are listed, but only abnormal results are displayed) Labs Reviewed  URINE CULTURE - Abnormal; Notable for the following components:      Result Value   Culture   (*)    Value: <10,000 COLONIES/mL INSIGNIFICANT GROWTH Performed at [facility], [address] [place], [place] [address]    All other components within normal limits  BASIC METABOLIC PANEL - Abnormal; Notable for the following components:   CO2 21 (*)    Glucose, Bld 103 (*)    All other components within normal limits   CBC - Abnormal; Notable for the following components:   Hemoglobin 10.0 (*)    HCT 33.5 (*)    MCV 69.9 (*)    MCH 20.9 (*)    MCHC 29.9 (*)  RDW 16.2 (*)    All other components within normal limits  URINALYSIS, ROUTINE W REFLEX MICROSCOPIC - Abnormal; Notable for the following components:   Color, Urine AMBER (*)    APPearance CLOUDY (*)    Glucose, UA 50 (*)    Bilirubin Urine SMALL (*)    Ketones, ur 5 (*)    Protein, ur >=300 (*)    Bacteria, UA MANY (*)    All other components within normal limits  D-DIMER, QUANTITATIVE (NOT AT [facility]) - Abnormal; Notable for the following components:   D-Dimer, Quant >20.00 (*)    All other components within normal limits  IRON AND TIBC - Abnormal; Notable for the following components:   Iron 15 (*)    Saturation Ratios 3 (*)    All other components within normal limits  FERRITIN - Abnormal; Notable for the following components:   Ferritin 6 (*)    All other components within normal limits  TROPONIN I - Abnormal; Notable for the following components:   Troponin I 0.12 (*)    All other components within normal limits  TROPONIN I - Abnormal; Notable for the following components:   Troponin I 0.10 (*)    All other components within normal limits  TROPONIN I - Abnormal; Notable for the following components:   Troponin I 0.08 (*)    All other components within normal limits  BRAIN NATRIURETIC PEPTIDE - Abnormal; Notable for the following components:   B Natriuretic Peptide 541.4 (*)    All other components within normal limits  LUPUS ANTICOAGULANT PANEL - Abnormal; Notable for the following components:   PTT Lupus Anticoagulant 54.2 (*)    DRVVT 48.9 (*)    All other components within normal limits  COMPREHENSIVE METABOLIC PANEL - Abnormal; Notable for the following components:   CO2 19 (*)    Albumin 3.3 (*)    All other components within normal limits  CBC - Abnormal; Notable for the following components:   Hemoglobin 9.2 (*)     HCT 30.4 (*)    MCV 69.6 (*)    MCH 21.1 (*)    RDW 16.1 (*)    All other components within normal limits  HEPARIN LEVEL (UNFRACTIONATED) - Abnormal; Notable for the following components:   Heparin Unfractionated 0.71 (*)    All other components within normal limits  CBC - Abnormal; Notable for the following components:   Hemoglobin 8.6 (*)    HCT 28.5 (*)    MCV 70.0 (*)    MCH 21.1 (*)    RDW 16.3 (*)    All other components within normal limits  CBC - Abnormal; Notable for the following components:   Hemoglobin 8.4 (*)    HCT 28.2 (*)    MCV 69.6 (*)    MCH 20.7 (*)    MCHC 29.8 (*)    RDW 16.4 (*)    All other components within normal limits  PTT-LA MIX - Abnormal; Notable for the following components:   PTT-LA Mix 49.6 (*)    All other components within normal limits  MRSA PCR SCREENING  HEPARIN LEVEL (UNFRACTIONATED)  VITAMIN B12  FOLATE  RETICULOCYTES  ANA W/REFLEX IF POSITIVE  HIV ANTIBODY (ROUTINE TESTING W REFLEX)  MAGNESIUM  PHOSPHORUS  TSH  HEPARIN LEVEL (UNFRACTIONATED)  HEPARIN LEVEL (UNFRACTIONATED)  HEXAGONAL PHASE PHOSPHOLIPID  DRVVT MIX  CBG MONITORING, ED  I-STAT BETA HCG BLOOD, ED ([facility], [facility], [facility] ONLY)    EKG EKG Interpretation  Date/Time:  Tuesday January 26 2018 13:13:20 EDT Ventricular Rate:  107 PR Interval:    QRS Duration: 100 QT Interval:  385 QTC Calculation: 514 R Axis:   36 Text Interpretation:  Sinus tachycardia Nonspecific T wave abnormality No previous tracing Confirmed by Cathren Laine ([id number]) on 01/26/2018 2:11:30 PM   Radiology No results found.  Procedures .Critical Care Performed by: Claude Manges, PA-C Authorized by: Claude Manges, PA-C   Critical care provider statement:    Critical care time (minutes):  45   Critical care start time:  01/26/2018 10:00 AM   Critical care end time:  01/26/2018 10:40 AM   Critical care was necessary to treat or prevent imminent or life-threatening deterioration of the following  conditions:  Cardiac failure   Critical care was time spent personally by me on the following activities:  Discussions with consultants, evaluation of patient's response to treatment, examination of patient, ordering and performing treatments and interventions, ordering and review of laboratory studies, ordering and review of radiographic studies, pulse oximetry, re-evaluation of patient's condition, obtaining history from patient or surrogate and review of old charts   (including critical care time)  Medications Ordered in ED Medications  iopamidol (ISOVUE-370) 76 % injection (has no administration in time range)  0.9 %  sodium chloride infusion ( Intravenous Rate/Dose Verify 01/27/18 0200)  heparin ADULT infusion 100 units/mL (25000 units/277mL sodium chloride 0.45%) (1,150 Units/hr Intravenous Rate/Dose Verify 01/28/18 0306)  sodium chloride 0.9 % bolus 1,000 mL (0 mLs Intravenous Stopped 01/26/18 1700)  iopamidol (ISOVUE-370) 76 % injection 100 mL (100 mLs Intravenous Contrast Given 01/26/18 1659)  sodium chloride 0.9 % injection (1,000 mLs  Given 01/26/18 2255)  heparin bolus via infusion 2,000 Units (2,000 Units Intravenous Bolus from Bag 01/26/18 1840)  Influenza vac split quadrivalent PF (FLUARIX) injection 0.5 mL (0.5 mLs Intramuscular Given 01/29/18 1056)  ferumoxytol (FERAHEME) 510 mg in sodium chloride 0.9 % 100 mL IVPB (0 mg Intravenous Stopped 01/27/18 0935)  cyanocobalamin ((VITAMIN B-12)) injection 1,000 mcg (1,000 mcg Subcutaneous Given 01/29/18 0914)     Initial Impression / Assessment and Plan / ED Course  I have reviewed the triage vital signs and the nursing notes.  Pertinent labs & imaging results that were available during my care of the patient were reviewed by me and considered in my medical decision making (see chart for details).  Clinical Course as of Apr 16 1039  Tue Jan 26, 2018  1629 D-dimer, quantitative (not at [facility]) [JS]    Clinical Course User Index [JS] Claude Manges, PA-C    Presenting from [place] after syncopal episode in the shower.  She was found by roommates after collapsing.  Patient reports she was placed on birth control 3 months ago and has been feeling more winded while walking to class.  Reports she was seen recently at the health center of her school and given an inhaler as they thought she was more likely suffering from allergies.  Patient is having a really hard time taking a deep breath she is slightly tachycardic along with some hypoxia.  Suspicion for pulmonary embolism will obtain d-dimer.  CT head obtained due to loss of consciousness, no acute abnormality noted.  D-dimer presents elevated was run a second time and is continued to be elevated will order CT chest to rule out PE.  DG chest 2 view show cardiomegaly on exam. CT angios chest PE showed: Massive pulmonary embolus in the right main pulmonary artery with  pulmonary embolus involving bilateral segment  total pulmonary  arteries of all lobes. There is evidence of right heart strain.  Pulmonary infarcts are identified in bilateral lower lobes.    Cardiomegaly. Small pericardial effusion.   Call placed to critical care team, critical care physician advised patient does not need TPA.  She is approved for hospitalist admission.  Will place hospitalist admission.  Talk to Dr. Adela Glimpse who will see and evaluate patient in the ED.  Patient has been informed of results and is tearful during discussion, family member at the bedside has arrived to keep her company, he is a cousin.  Shin understanding of plan and management.  Final Clinical Impressions(s) / ED Diagnoses   Final diagnoses:  Bilateral pulmonary embolism (HCC)  Pericardial effusion  Anemia, unspecified type    ED Discharge Orders         Ordered    apixaban (ELIQUIS) 5 MG TABS tablet  2 times daily,   Status:  Discontinued     01/29/18 0835    apixaban (ELIQUIS) 5 MG TABS tablet  2 times daily,   Status:   Discontinued     01/29/18 0835    ferrous sulfate 325 (65 FE) MG EC tablet  2 times daily with meals     01/29/18 0835    vitamin B-12 (CYANOCOBALAMIN) 1000 MCG tablet  Daily,   Status:  Discontinued     01/29/18 0835    Increase activity slowly     01/29/18 0835    Diet general     01/29/18 0835    polyethylene glycol (MIRALAX / GLYCOLAX) packet  Daily,   Status:  Discontinued     01/29/18 0835    vitamin B-12 (CYANOCOBALAMIN) 1000 MCG tablet  Daily     01/29/18 0839    polyethylene glycol (MIRALAX / GLYCOLAX) packet  Daily     01/29/18 0839           Claude Manges, PA-C 03/23/18 0839    Cathren Laine, MD 03/23/18 1811    Claude Manges, PA-C 04/15/18 1041    Claude Manges, PA-C 04/15/18 1042    Cathren Laine, MD 04/16/18 Ernestina [place]

## 2018-03-30 ENCOUNTER — Telehealth: Payer: Self-pay | Admitting: Primary Care

## 2018-03-30 NOTE — Telephone Encounter (Signed)
ONO results; patients oxygen level was 89% or less for 0.2 mins which does not meet guidelines for oxygen coverage. Not needed.

## 2018-05-10 ENCOUNTER — Ambulatory Visit: Payer: 59 | Admitting: Pulmonary Disease

## 2018-05-10 ENCOUNTER — Encounter: Payer: Self-pay | Admitting: Pulmonary Disease

## 2018-05-10 VITALS — BP 120/80 | HR 72 | Ht 64.0 in | Wt 139.4 lb

## 2018-05-10 DIAGNOSIS — I2699 Other pulmonary embolism without acute cor pulmonale: Secondary | ICD-10-CM

## 2018-05-10 DIAGNOSIS — Z7901 Long term (current) use of anticoagulants: Secondary | ICD-10-CM
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
## 2018-05-10 NOTE — Addendum Note (Signed)
Addended by: Boone Master E on: 05/10/2018 11:55 AM   Modules accepted: Orders

## 2018-05-10 NOTE — Patient Instructions (Addendum)
Thank you for visiting Dr. Tonia Brooms at [facility]. Today we recommend the following: Orders Placed This Encounter  Procedures  . Factor 5 leiden  . Prothrombin Gene Mutation  . Protein C Deficiency Profile  . PROTEIN S PANEL  . Antithrombin panel   No orders of the defined types were placed in this encounter.  Return in about 3 months (around 08/09/2018).

## 2018-05-10 NOTE — Progress Notes (Deleted)
Synopsis: Referred in0 Jan 2020 for follow up for pulmonary embolism after hospitalization.   Subjective:   PATIENT ID: Mariah Yang GENDER: female DOB: 2001-01-06, MRN: [id number]  No chief complaint on file.   HPI  ***  Past Medical History:  Diagnosis Date  . Pulmonary embolism (HCC)      Family History  Problem Relation Age of Onset  . Healthy Mother   . Healthy Father   . Diabetes Other   . Cancer Neg Hx   . Stroke Neg Hx   . CAD Neg Hx   . Clotting disorder Neg Hx   . Lupus Neg Hx   . Kidney disease Neg Hx      Past Surgical History:  Procedure Laterality Date  . NO PAST SURGERIES      Social History   Socioeconomic History  . Marital status: Single    Spouse name: Not on file  . Number of children: Not on file  . Years of education: Not on file  . Highest education level: Not on file  Occupational History  . Not on file  Social Needs  . Financial resource strain: Not on file  . Food insecurity:    Worry: Not on file    Inability: Not on file  . Transportation needs:    Medical: Not on file    Non-medical: Not on file  Tobacco Use  . Smoking status: Never Smoker  . Smokeless tobacco: Never Used  Substance and Sexual Activity  . Alcohol use: Never    Frequency: Never  . Drug use: Never  . Sexual activity: Not on file  Lifestyle  . Physical activity:    Days per week: Not on file    Minutes per session: Not on file  . Stress: Not on file  Relationships  . Social connections:    Talks on phone: Not on file    Gets together: Not on file    Attends religious service: Not on file    Active member of club or organization: Not on file    Attends meetings of clubs or organizations: Not on file    Relationship status: Not on file  . Intimate partner violence:    Fear of current or ex partner: Not on file    Emotionally abused: Not on file    Physically abused: Not on file    Forced sexual activity: Not on file  Other Topics Concern  . Not  on file  Social History Narrative  . Not on file     Allergies  Allergen Reactions  . Amoxicillin-Pot Clavulanate Rash     Outpatient Medications Prior to Visit  Medication Sig Dispense Refill  . apixaban (ELIQUIS) 5 MG TABS tablet Take 1 tablet (5 mg total) by mouth 2 (two) times daily. 60 tablet 6  . ferrous sulfate 325 (65 FE) MG EC tablet Take 1 tablet (325 mg total) by mouth 2 (two) times daily with a meal. 60 tablet 3  . polyethylene glycol (MIRALAX / GLYCOLAX) packet Take 17 g by mouth daily. 14 each 0  . PROAIR HFA 108 (90 Base) MCG/ACT inhaler     . vitamin B-12 (CYANOCOBALAMIN) 1000 MCG tablet Take 1 tablet (1,000 mcg total) by mouth daily. 30 tablet 0   No facility-administered medications prior to visit.     ROS   Objective:  Physical Exam   There were no vitals filed for this visit.   on *** LPM *** RA BMI Readings from  Last 3 Encounters:  02/22/18 22.18 kg/m (57 %, Z= 0.18)*  02/08/18 21.87 kg/m (54 %, Z= 0.10)*  01/26/18 21.53 kg/m (50 %, Z= 0.00)*   * Growth percentiles are based on CDC (Girls, 2-20 Years) data.   Wt Readings from Last 3 Encounters:  02/22/18 130 lb 3.2 oz (59.1 kg) (57 %, Z= 0.17)*  02/08/18 128 lb 6.4 oz (58.2 kg) (54 %, Z= 0.09)*  01/26/18 129 lb 6.6 oz (58.7 kg) (56 %, Z= 0.14)*   * Growth percentiles are based on CDC (Girls, 2-20 Years) data.     CBC    Component Value Date/Time   WBC 5.0 02/08/2018 1524   RBC 4.60 02/08/2018 1524   HGB 10.6 (L) 02/08/2018 1524   HCT 34.6 (L) 02/08/2018 1524   PLT 349.0 02/08/2018 1524   MCV 75.2 (L) 02/08/2018 1524   MCH 20.7 (L) 01/29/2018 0331   MCHC 30.7 (L) 02/08/2018 1524   RDW 27.3 (H) 02/08/2018 1524    ***  Chest Imaging: ***  Pulmonary Functions Testing Results:  FeNO: ***  Pathology: ***  Echocardiogram: ***  Heart Catheterization: ***    Assessment & Plan:   Pulmonary embolus and infarction [facility](HCC)  Discussion: ***   Current Outpatient Medications:    .  apixaban (ELIQUIS) 5 MG TABS tablet, Take 1 tablet (5 mg total) by mouth 2 (two) times daily., Disp: 60 tablet, Rfl: 6 .  ferrous sulfate 325 (65 FE) MG EC tablet, Take 1 tablet (325 mg total) by mouth 2 (two) times daily with a meal., Disp: 60 tablet, Rfl: 3 .  polyethylene glycol (MIRALAX / GLYCOLAX) packet, Take 17 g by mouth daily., Disp: 14 each, Rfl: 0 .  PROAIR HFA 108 (90 Base) MCG/ACT inhaler, , Disp: , Rfl:  .  vitamin B-12 (CYANOCOBALAMIN) 1000 MCG tablet, Take 1 tablet (1,000 mcg total) by mouth daily., Disp: 30 tablet, Rfl: 0   Josephine IgoBradley L Mauricia Mertens, DO [facility] 05/10/2018 9:14 AM

## 2018-05-11 ENCOUNTER — Ambulatory Visit: Payer: 59 | Admitting: Pulmonary Disease

## 2018-05-12 LAB — PROTEIN S PANEL
Protein S Activity: 81 % (ref 63–140)
Protein S Ag, Free: 69 % (ref 57–157)
Protein S Ag, Total: 85 % (ref 60–150)

## 2018-05-12 LAB — PROTEIN C DEFICIENCY PROFILE
PROTEIN C ACTIVITY: 101 % (ref 73–180)
PROTEIN C ANTIGEN: 104 % (ref 60–150)

## 2018-05-12 LAB — ANTITHROMBIN PANEL
AT III AG PPP IMM-ACNC: 89 % (ref 72–124)
AntiThromb III Func: 141 % — ABNORMAL HIGH (ref 75–135)

## 2018-05-14 LAB — FACTOR 5 LEIDEN

## 2018-05-14 LAB — PROTHROMBIN GENE MUTATION

## 2018-05-26 ENCOUNTER — Telehealth: Payer: Self-pay | Admitting: Pulmonary Disease

## 2018-05-26 NOTE — Telephone Encounter (Signed)
Called and spoke with Patient.  She requested her last OV note with Dr Tonia Brooms, 05/10/18, be faxed to Dr Lou Miner.  Called [phone], Dr Edison Pace office, received fax # [phone].  Fax sent and confirmation received.  Nothing further at this time.

## 2018-05-26 NOTE — Telephone Encounter (Signed)
Pt is requesting her last office notes to be faxed to her PCP Cammie Mcgee. Could not locate PCP in Epic to update chart.  Phone number for PCP: [phone]

## 2018-07-23 ENCOUNTER — Telehealth: Payer: Self-pay

## 2018-07-23 NOTE — Telephone Encounter (Signed)
Called pt to cancel her 4/22 appt with BI.  While on the phone, pt expressed concern about when to stop her Eliquis.  Her OV on 4/22 was to discuss d/c'ing Eliquis.  Pt states she is having no complaints at this time.  Also notes that if she is to continue this medication she will need a refill.  BI please advise on Eliquis recs.  Thanks!

## 2018-07-27 NOTE — Telephone Encounter (Signed)
I'm glad she is doing well. I would continue Eliquis for 1 month and schedule phone visit with me in mid-April. Refill 1 month. Thanks.

## 2018-07-27 NOTE — Telephone Encounter (Signed)
Called and spoke with patient regarding EW recommendations. Pt verbalized understanding had no further concerns Pt advised she has plenty of Eliquis to last until next ov Scheduled telephone visit for 08/16/18 at 1030am Nothing further needed.

## 2018-07-27 NOTE — Telephone Encounter (Signed)
Primary Pulmonologist: BI Last office visit and with whom: 05/10/2018 with BI What do we see them for (pulmonary problems): Pulmonary Embolus and infarction Last OV assessment/plan: Thank you for visiting Dr. Tonia Brooms at [facility]. Today we recommend the following:    Orders Placed This Encounter  Procedures  . Factor 5 leiden  . Prothrombin Gene Mutation  . Protein C Deficiency Profile  . PROTEIN S PANEL  . Antithrombin panel   No orders of the defined types were placed in this encounter.  Return in about 3 months (around 08/09/2018).   Was appointment offered to patient (explain)?  Pt would like message sent to decide if telephone visit is needed.   Reason for call: Pt expressed concern about when to stop her Eliquis as her upcoming appointment with BI on 08/18/18 was to discuss if pt should remain or stop taking Eliquis. Pt states she is in no pain, has no SOB, no wheezing, and no coughing at this time. She is doing well overall. Pt advise if she does need to cont this medication she will need a refill. She asked if telephone visit is needed around 08/18/2018 she is willing to do so.  BW please advise. Thank you.

## 2018-08-13 NOTE — Progress Notes (Signed)
Virtual Visit via Telephone Note  I connected with Mariah Yang on 08/13/18 at 10:30 AM EDT by telephone and verified that I am speaking with the correct person using two identifiers.   I discussed the limitations, risks, security and privacy concerns of performing an evaluation and management service by telephone and the availability of in person appointments. I also discussed with the patient that there may be a patient responsible charge related to this service. The patient expressed understanding and agreed to proceed.   History of Present Illness: 18 year old female, never smoked. PMH significant for provoked VTE, submassive pulmonary embolism. Patient of Dr. Tonia Brooms, last seen on 05/10/18. Recommended to continue on Eliquis for duration of 6 months d/t low risk bleeding. She did use oral contraceptive pill prior which she has since stopped. Genetic testing and blood work for inheritable cuases of clot formation appear normal. Negative for factor V gene. Negative for variant in the prothrombin/factor II gene. Antithrombin III func mildly elevated d/t current anticoagulation use.  She is doing well. Breathing has been fine. States that she walks over a mile to class without any difficulty. Recently started exercising again, reports that she has been getting winded easier but feels it's d/t lack of conditioning. Denies leg swelling, cough or chest pain. She does have occasional back pain lasting for a few seconds occurring randomly once a week or less. Dr. Tonia Brooms aware of above and no need for additional testing/labs.   Observations/Objective:  - No shortness of breath, wheezing or cough  Assessment and Plan:  Pulmonary embolism - Provoked submassive pulmonary emboli in October 2019 presumedly d/t OCP  - Completed 6-7 months of anticoagulation with Eliquis  - Genetic testing and blood work for inheritable causes of clot formation appear normal Recommendations: - Ok to stop Eliquis - Start ASA  81mg  daily, preferably lifelong - No need for additional testing/labs - Stay away from any oral contraceptive pills at this time.  - Follow up with GYN for counseling regarding birth control options, recommend using back up method such as condoms if sexually active  Follow Up Instructions:   - Following up with our office as needed moving forward or if symptoms return/worsen  I discussed the assessment and treatment plan with the patient. The patient was provided an opportunity to ask questions and all were answered. The patient agreed with the plan and demonstrated an understanding of the instructions.   The patient was advised to call back or seek an in-person evaluation if the symptoms worsen or if the condition fails to improve as anticipated.  I provided 20 minutes of non-face-to-face time during this encounter.   Glenford Bayley, NP

## 2018-08-16 ENCOUNTER — Other Ambulatory Visit: Payer: Self-pay

## 2018-08-16 ENCOUNTER — Ambulatory Visit (INDEPENDENT_AMBULATORY_CARE_PROVIDER_SITE_OTHER): Payer: 59 | Admitting: Primary Care

## 2018-08-16 ENCOUNTER — Encounter: Payer: Self-pay | Admitting: Primary Care

## 2018-08-16 DIAGNOSIS — I2699 Other pulmonary embolism without acute cor pulmonale: Secondary | ICD-10-CM

## 2018-08-16 NOTE — Patient Instructions (Addendum)
-   Ok to stop Eliquis  - Start taking baby aspirin (81mg ) every day  - preferably lifelong   - Genetics and blood work for inheritable causes of clot formation look normal  - No additional labs needed  - Ok to resume normal activities   - Follow up with our office only as needed   Recommendations: - Make sure to discuss alternative birth control options with your GYN and let them know of previous history of blood clot while on oral birth control pill  - Recommend you use back up method such as condoms if sexually active   - If become pregnant make sure to let OB/GYN know about previous history of blood clot

## 2018-08-18 ENCOUNTER — Ambulatory Visit: Payer: 59 | Admitting: Pulmonary Disease

## 2018-09-10 ENCOUNTER — Telehealth: Payer: Self-pay | Admitting: Primary Care

## 2018-09-10 NOTE — Telephone Encounter (Signed)
Contacted patient to review recommendations by B. Clent Ridges, NP.  Patient acknowledged she is taking the 'baby asprin' 81mg  daily.  Advised that B. Clent Ridges recommends pregnancy test if she has been sexually active within past 3 months and she should use condom or some other form of birth control at ALL times during sex.  Stressed importance of GYN and offered referral for her.  She states her mother is with her now and is contacting their insurance to see which GYN office they prefer and if anything additional is needed.  Advised patient we could place referral but she declined stating my mom said she would make sure it was done and they hoped to get the appointment scheduled today and not wait.  Stressed to patient that this is important for her future health to get established with GYN as well as for her current concerns.  Patient acknowledged understanding.  Asked patient to please contact us by first of next week if she has not made an appointment or needed the referral that B. Walsh recommended.  Nothing further needed at this time.

## 2018-09-10 NOTE — Telephone Encounter (Signed)
Returned call to patient.  Patient states she has been off Eliquis and began aspirin about '6 weeks ago.'  She is 11 days late for her menses cycle, stating her periods are usually very regular.  She was inquiring as to whether or not the aspirin may cause a delay in her cycle. Denies breast tenderness or any other symptoms.  She spoke with her PCP who she states told her to call pulmonary because we prescribed the aspirin. Informed her I did not know of any reason this would cause a delay in her menses but will route message to our provider for review and feedback.   Patient states she is 'absolutely sure' she is not pregnant and has not been sexually active recently.  Advised patient she should contact her Gyn provider but she states she does not have one and has never seen one.  She plans to make an appointment soon.  Also mentioned she could use a home pregnancy test to make sure she is not pregnant even though she states it's not possible.    Will route to Buelah Manis, NP for review and further recommendations.   Beth please advise .  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  - Follow up with GYN for counseling regarding birth control options, recommend using back up method such as condoms if sexually active  Follow Up Instructions:  - Following up with our office as needed moving forward or if symptoms return/worsen  I discussed the assessment and treatment  plan with the patient. The patient was provided an opportunity to ask questions and all were answered. The patient agreed with the plan and demonstrated an understanding of the instructions.  The patient was advised to call back or seek an in-person evaluation if the symptoms worsen or if the condition fails to improve as anticipated.  Was appointment offered to patient (explain)?  No - not pulmonary issue   Reason for call: 11 days late on menses and questions if due to aspirin  Allergies as of 09/10/2018  Review status set to Review Complete on 08/16/2018   Noted Reaction Type Reactions  Amoxicillin-pot Clavulanate 01/01/2010   Rash  This is your current Medication list. Please update and make changes  Medications  Valid as of: Sep 10, 2018 - 3:06 PM  Generic Name Brand Name Tablet Size Instructions for use  Albuterol Sulfate (Aero Soln) ProAir HFA 108 (90 Base) MCG/ACT     Aspirin (Chew Tab) aspirin 81 MG Chew 81 mg by mouth daily.    Cyanocobalamin (Tab) CYANOCOBALAMIN 1000 MCG Take 1 tablet (1,000 mcg total) by mouth daily.    Ferrous Sulfate (Tablet Delayed Response) ferrous sulfate 325 (65 FE) MG Take 1 tablet (325 mg total) by mouth 2 (two) times daily with a meal.    Polyethylene Glycol 3350 (Pack) MIRALAX / GLYCOLAX 17 g Take 17 g by mouth daily.    Marland Kitchen       Marland Kitchen

## 2018-09-10 NOTE — Telephone Encounter (Signed)
No, it should not prevent her from getting her menses. She should only be taking a baby asa (81mg  daily). If she has been sexually active in the last 3 months she needs a pregnancy test. She should be using a back up method at ALL times if sexually active. And I have told her she need to see GYN, please place referral.

## 2019-01-19 ENCOUNTER — Other Ambulatory Visit: Payer: Self-pay

## 2019-01-19 ENCOUNTER — Ambulatory Visit (INDEPENDENT_AMBULATORY_CARE_PROVIDER_SITE_OTHER)
Admission: RE | Admit: 2019-01-19 | Discharge: 2019-01-19 | Disposition: A | Discharge status: Home / Self Care | Payer: 59 | Source: Ambulatory Visit

## 2019-01-19 DIAGNOSIS — Z20828 Contact with and (suspected) exposure to other viral communicable diseases: Secondary | ICD-10-CM

## 2019-01-19 DIAGNOSIS — R509 Fever, unspecified: Secondary | ICD-10-CM | POA: Diagnosis not present

## 2019-01-19 DIAGNOSIS — Z20822 Contact with and (suspected) exposure to covid-19: Secondary | ICD-10-CM

## 2019-01-19 NOTE — Discharge Instructions (Signed)
COVID testing ordered.  Go to [facility] [place] for drive-thru testing  In the meantime: You should remain isolated in your home for 10 days from symptom onset AND greater than 72 hours after symptoms resolution (absence of fever without the use of fever-reducing medication and improvement in respiratory symptoms), whichever is longer Get plenty of rest and push fluids Use OTC medications like ibuprofen or tylenol as needed fever or pain Call or go to the ED if you have any new or worsening symptoms such as fever, worsening cough, shortness of breath, chest tightness, chest pain, turning blue, changes in mental status, etc..Marland Kitchen

## 2019-01-19 NOTE — ED Provider Notes (Signed)
[facility] Virtual Visit via Video Note:  Mariah Yang  initiated request for Telemedicine visit with Mariah Yang. I connected with Mariah Yang  on 01/19/2019 at 2:05 PM  for a synchronized telemedicine visit using a video enabled HIPPA compliant telemedicine application. I verified that I am speaking with Mariah Yang  using two identifiers. Mariah Harding, PA-C  was physically located in a [facility] Urgent care site and Mariah Yang was located at a different location.   The limitations of evaluation and management by telemedicine as well as the availability of in-person appointments were discussed. Patient was informed that she  may incur a bill ( including co-pay) for this virtual visit encounter. Mariah Yang  expressed understanding and gave verbal consent to proceed with virtual visit.   [id number] 01/19/19 Arrival Time: 1354  CC: Fever  SUBJECTIVE: History from: patient.  Mariah Yang is a 18 y.o. female who presents with fever, tmax of 103 yesterday, x 3 days.  Denies sick exposure to COVID, flu or strep.  Denies recent travel.   Has tried tylenol without relief.  Denies aggravating factors.  Denies previous symptoms in the past.   Denies chills, fatigue, rhinorrhea, congestion,, sore throat, cough, SOB, wheezing, chest pain, nausea, vomiting, changes in bowel or bladder habits.    ROS: As per HPI.  All other pertinent ROS negative.     Past Medical History:  Diagnosis Date  . Pulmonary embolism [facility])    Past Surgical History:  Procedure Laterality Date  . NO PAST SURGERIES     Allergies  Allergen Reactions  . Amoxicillin-Pot Clavulanate Rash   No current facility-administered medications on file prior to encounter.    Current Outpatient Medications on File Prior to Encounter  Medication Sig Dispense Refill  . aspirin 81 MG chewable tablet Chew 81 mg by mouth daily.    . polyethylene glycol (MIRALAX / GLYCOLAX) packet Take 17 g by mouth  daily. 14 each 0  . PROAIR HFA 108 (90 Base) MCG/ACT inhaler     . [DISCONTINUED] ferrous sulfate 325 (65 FE) MG EC tablet Take 1 tablet (325 mg total) by mouth 2 (two) times daily with a meal. (Patient not taking: Reported on 08/16/2018) 60 tablet 3    OBJECTIVE:   There were no vitals filed for this visit.  General appearance: alert; no distress Eyes: EOMI grossly HENT: normocephalic; atraumatic Neck: supple with FROM Lungs: normal respiratory effort; speaking in full sentences without difficulty Extremities: moves extremities without difficulty Skin: No obvious rashes Neurologic: No facial asymmetries Psychological: alert and cooperative; normal mood and affect  ASSESSMENT & PLAN:  1. Suspected Covid-19 Virus Infection   2. Fever, unspecified     COVID testing ordered.  Go to [address] [place] [employer] for drive-thru testing  In the meantime: You should remain isolated in your home for 10 days from symptom onset AND greater than 72 hours after symptoms resolution (absence of fever without the use of fever-reducing medication and improvement in respiratory symptoms), whichever is longer Get plenty of rest and push fluids Use OTC medications like ibuprofen or tylenol as needed fever or pain Call or go to the ED if you have any new or worsening symptoms such as fever, worsening cough, shortness of breath, chest tightness, chest pain, turning blue, changes in mental status, etc...  I discussed the assessment and treatment plan with the patient. The patient was provided an opportunity to ask questions and all were answered.  The patient agreed with the plan and demonstrated an understanding of the instructions.   The patient was advised to call back or seek an in-person evaluation if the symptoms worsen or if the condition fails to improve as anticipated.  I provided 10 minutes of non-face-to-face time during this encounter.  [place], PA-C  01/19/2019 2:05 PM           Lestine Box, PA-C 01/19/19 1409

## 2019-01-20 ENCOUNTER — Other Ambulatory Visit: Payer: Self-pay

## 2019-01-20 DIAGNOSIS — Z20822 Contact with and (suspected) exposure to covid-19: Secondary | ICD-10-CM

## 2019-01-21 LAB — NOVEL CORONAVIRUS, NAA: SARS-CoV-2, NAA: NOT DETECTED

## 2019-02-02 ENCOUNTER — Ambulatory Visit: Payer: 59 | Admitting: Certified Nurse Midwife

## 2019-02-03 ENCOUNTER — Other Ambulatory Visit: Payer: Self-pay

## 2019-02-03 ENCOUNTER — Encounter: Payer: Self-pay | Admitting: Nurse Practitioner

## 2019-02-03 ENCOUNTER — Ambulatory Visit (INDEPENDENT_AMBULATORY_CARE_PROVIDER_SITE_OTHER): Payer: 59 | Admitting: Nurse Practitioner

## 2019-02-03 VITALS — BP 117/74 | HR 78 | Ht 65.0 in | Wt 132.4 lb

## 2019-02-03 DIAGNOSIS — Z3202 Encounter for pregnancy test, result negative: Secondary | ICD-10-CM

## 2019-02-03 DIAGNOSIS — Z01411 Encounter for gynecological examination (general) (routine) with abnormal findings: Secondary | ICD-10-CM | POA: Diagnosis not present

## 2019-02-03 DIAGNOSIS — Z30011 Encounter for initial prescription of contraceptive pills: Secondary | ICD-10-CM

## 2019-02-03 DIAGNOSIS — N926 Irregular menstruation, unspecified: Secondary | ICD-10-CM

## 2019-02-03 DIAGNOSIS — Z23 Encounter for immunization: Secondary | ICD-10-CM

## 2019-02-03 DIAGNOSIS — Z7901 Long term (current) use of anticoagulants: Secondary | ICD-10-CM

## 2019-02-03 DIAGNOSIS — R55 Syncope and collapse: Secondary | ICD-10-CM

## 2019-02-03 LAB — POCT URINE PREGNANCY: Preg Test, Ur: NEGATIVE

## 2019-02-03 MED ORDER — NORETHINDRONE 0.35 MG PO TABS: 1.0000 | ORAL_TABLET | Freq: Every day | ORAL | 2 refills | Status: AC

## 2019-02-03 NOTE — Progress Notes (Signed)
Pt presents for new AEX. Pt states that she has no concerns today. Patient does not want to have STD testing done. She denies having and vaginal discharge, odor, or irritation.

## 2019-02-03 NOTE — Patient Instructions (Signed)
Norethindrone tablets (contraception) What is this medicine? NORETHINDRONE (nor eth IN drone) is an oral contraceptive. The product contains a female hormone known as a progestin. It is used to prevent pregnancy. This medicine may be used for other purposes; ask your health care provider or pharmacist if you have questions. COMMON BRAND NAME(S): Camila, Deblitane 28-Day, Errin, Heather, Jencycla, Jolivette, Lyza, Nor-QD, Nora-BE, Norlyroc, Ortho Micronor, Sharobel 28-Day What should I tell my health care provider before I take this medicine? They need to know if you have any of these conditions:  blood vessel disease or blood clots  breast, cervical, or vaginal cancer  diabetes  heart disease  kidney disease  liver disease  mental depression  migraine  seizures  stroke  vaginal bleeding  an unusual or allergic reaction to norethindrone, other medicines, foods, dyes, or preservatives  pregnant or trying to get pregnant  breast-feeding How should I use this medicine? Take this medicine by mouth with a glass of water. You may take it with or without food. Follow the directions on the prescription label. Take this medicine at the same time each day and in the order directed on the package. Do not take your medicine more often than directed. Contact your pediatrician regarding the use of this medicine in children. Special care may be needed. This medicine has been used in female children who have started having menstrual periods. A patient package insert for the product will be given with each prescription and refill. Read this sheet carefully each time. The sheet may change frequently. Overdosage: If you think you have taken too much of this medicine contact a poison control center or emergency room at once. NOTE: This medicine is only for you. Do not share this medicine with others. What if I miss a dose? Try not to miss a dose. Every time you miss a dose or take a dose late  your chance of pregnancy increases. When 1 pill is missed (even if only 3 hours late), take the missed pill as soon as possible and continue taking a pill each day at the regular time (use a back up method of birth control for the next 48 hours). If more than 1 dose is missed, use an additional birth control method for the rest of your pill pack until menses occurs. Contact your health care professional if more than 1 dose has been missed. What may interact with this medicine? Do not take this medicine with any of the following medications:  amprenavir or fosamprenavir  bosentan This medicine may also interact with the following medications:  antibiotics or medicines for infections, especially rifampin, rifabutin, rifapentine, and griseofulvin, and possibly penicillins or tetracyclines  aprepitant  barbiturate medicines, such as phenobarbital  carbamazepine  felbamate  modafinil  oxcarbazepine  phenytoin  ritonavir or other medicines for HIV infection or AIDS  St. John's wort  topiramate This list may not describe all possible interactions. Give your health care provider a list of all the medicines, herbs, non-prescription drugs, or dietary supplements you use. Also tell them if you smoke, drink alcohol, or use illegal drugs. Some items may interact with your medicine. What should I watch for while using this medicine? Visit your doctor or health care professional for regular checks on your progress. You will need a regular breast and pelvic exam and Pap smear while on this medicine. Use an additional method of birth control during the first cycle that you take these tablets. If you have any reason to think you   are pregnant, stop taking this medicine right away and contact your doctor or health care professional. If you are taking this medicine for hormone related problems, it may take several cycles of use to see improvement in your condition. This medicine does not protect you  against HIV infection (AIDS) or any other sexually transmitted diseases. What side effects may I notice from receiving this medicine? Side effects that you should report to your doctor or health care professional as soon as possible:  breast tenderness or discharge  pain in the abdomen, chest, groin or leg  severe headache  skin rash, itching, or hives  sudden shortness of breath  unusually weak or tired  vision or speech problems  yellowing of skin or eyes Side effects that usually do not require medical attention (report to your doctor or health care professional if they continue or are bothersome):  changes in sexual desire  change in menstrual flow  facial hair growth  fluid retention and swelling  headache  irritability  nausea  weight gain or loss This list may not describe all possible side effects. Call your doctor for medical advice about side effects. You may report side effects to FDA at 1-800-FDA-1088. Where should I keep my medicine? Keep out of the reach of children. Store at room temperature between 15 and 30 degrees C (59 and 86 degrees F). Throw away any unused medicine after the expiration date. NOTE: This sheet is a summary. It may not cover all possible information. If you have questions about this medicine, talk to your doctor, pharmacist, or health care provider.  2020 Elsevier/Gold Standard (2012-01-02 16:41:35)  

## 2019-02-03 NOTE — Progress Notes (Signed)
GYNECOLOGY ANNUAL PREVENTATIVE CARE ENCOUNTER NOTE  Subjective:   Mariah Yang is a 18 y.o. G0P0000 female here for a routine annual gynecologic exam.  Current complaints: periods are every 6 weeks instead of every 4 weeks like previously and now are heavy with lots of cramping.  Is able to take Tylenol and begins Tylenol 2 days before menses begins and takes around the clock and still the cramping is severe enough that she cannot continue her normal activities.  Also had STD testing including RPR and HIV done at college in September..  Started birth control pills in May 2019 and had pulmonary embolism in October 2019.  Is followed by PCP and pulmonologist due to the blood clots but now is doing well.  Had anticoagulant workup done by PCP and all tests were considered normal.  Wants some help with menses and knows that she cannot take the regular birth control pills.  Has discussed with her PCP who referred her to the GYN.   Denies abnormal vaginal bleeding, discharge, pelvic pain, problems with intercourse or other gynecologic concerns.    Gynecologic History Patient's last menstrual period was 01/17/2019. Contraception: condoms  Obstetric History OB History  Gravida Para Term Preterm AB Living  0 0 0 0 0 0  SAB TAB Ectopic Multiple Live Births  0 0 0 0 0    Past Medical History:  Diagnosis Date  . Pulmonary embolism [facility])     Past Surgical History:  Procedure Laterality Date  . NO PAST SURGERIES      Current Outpatient Medications on File Prior to Visit  Medication Sig Dispense Refill  . aspirin 81 MG chewable tablet Chew 81 mg by mouth daily.    . [DISCONTINUED] ferrous sulfate 325 (65 FE) MG EC tablet Take 1 tablet (325 mg total) by mouth 2 (two) times daily with a meal. (Patient not taking: Reported on 08/16/2018) 60 tablet 3   No current facility-administered medications on file prior to visit.     Allergies  Allergen Reactions  . Amoxicillin-Pot Clavulanate Rash     Social History   Socioeconomic History  . Marital status: Single    Spouse name: Not on file  . Number of children: Not on file  . Years of education: Not on file  . Highest education level: Not on file  Occupational History  . Not on file  Social Needs  . Financial resource strain: Not on file  . Food insecurity    Worry: Not on file    Inability: Not on file  . Transportation needs    Medical: Not on file    Non-medical: Not on file  Tobacco Use  . Smoking status: Never Smoker  . Smokeless tobacco: Never Used  Substance and Sexual Activity  . Alcohol use: Never    Frequency: Never  . Drug use: Never  . Sexual activity: Yes    Partners: Male    Birth control/protection: Condom  Lifestyle  . Physical activity    Days per week: Not on file    Minutes per session: Not on file  . Stress: Not on file  Relationships  . Social Musician on phone: Not on file    Gets together: Not on file    Attends religious service: Not on file    Active member of club or organization: Not on file    Attends meetings of clubs or organizations: Not on file    Relationship status: Not on  file  . Intimate partner violence    Fear of current or ex partner: Not on file    Emotionally abused: Not on file    Physically abused: Not on file    Forced sexual activity: Not on file  Other Topics Concern  . Not on file  Social History Narrative  . Not on file    Family History  Problem Relation Age of Onset  . Healthy Mother   . Healthy Father   . Diabetes Other   . Cancer Neg Hx   . Stroke Neg Hx   . CAD Neg Hx   . Clotting disorder Neg Hx   . Lupus Neg Hx   . Kidney disease Neg Hx     The following portions of the patient's history were reviewed and updated as appropriate: allergies, current medications, past family history, past medical history, past social history, past surgical history and problem list.  Review of Systems Pertinent items noted in HPI and remainder of  comprehensive ROS otherwise negative.   Objective:  BP 117/74   Pulse 78   Ht 5\' 5"  (1.651 m)   Wt 132 lb 6.4 oz (60.1 kg)   LMP 01/17/2019   BMI 22.03 kg/m  CONSTITUTIONAL: Well-developed, well-nourished female in no acute distress.  HENT:  Normocephalic, atraumatic, External right and left ear normal.  EYES: Conjunctivae and EOM are normal. Pupils are equal, round.  No scleral icterus.  NECK: Normal range of motion, supple, no masses.  Normal thyroid.  SKIN: Skin is warm and dry. No rash noted. Not diaphoretic. No erythema. No pallor. NEUROLOGIC: Alert and oriented to person, place, and time. Normal reflexes, muscle tone coordination. No cranial nerve deficit noted. PSYCHIATRIC: Normal mood and affect. Normal behavior. Normal judgment and thought content. CARDIOVASCULAR: Normal heart rate noted, regular rhythm RESPIRATORY: Clear to auscultation bilaterally. Effort and breath sounds normal, no problems with respiration noted. BREASTS: Symmetric in size. No masses, skin changes, nipple drainage, or lymphadenopathy. ABDOMEN: Soft, no distention noted.  No tenderness, rebound or guarding.  PELVIC: Deferred MUSCULOSKELETAL: Normal range of motion. No tenderness.  No cyanosis, clubbing, or edema.    Assessment and Plan:  1. Encounter for immunization Both done today.  - Tdap vaccine greater than or equal to 7yo IM - Flu Vaccine QUAD 36+ mos IM (Fluarix, Quad PF)  2. Irregular menses Consulted with the [place] [employer] Elig[employer]ilty Critera for Contraceptive Use, 2016 as client has had pulmonary embolism on combined pills.  Wanting something to make her bleeding less with mense and decrease her cramping.  As well her menstrual cycles are not as regular as previously.  Discussed progestin only options for her  - Depo injection, Progestin Only Pills, and Nexplanon if she did well using Depo.  Opted for POPs at present.  Emphasized that she cannot take estrogen and that she should check any pills  carefully to make sure they do not contain estrogen.  Client is in agreement.  Advised that these recommendations are not a guarantee that she will not have a clo, but the evidenced based medicine states that these options can be used with the benefit to use would outweigh the risk.  Return in 3 months for a recheck and refill of pills.  3. Encntr for gyn exam (general) (routine) w abnormal findings Having irregular and heavy menses  4.  History of DVT Takes daily baby aspirin See discussion above  Routine preventative health maintenance measures emphasized. Please refer to After Visit Summary for  other counseling recommendations.    Nolene Bernheim, RN, MSN, NP-BC Nurse Practitioner, [facility] for [facility]

## 2019-04-12 ENCOUNTER — Other Ambulatory Visit: Payer: Self-pay | Admitting: Nurse Practitioner

## 2019-06-27 ENCOUNTER — Ambulatory Visit: Payer: 59 | Attending: Internal Medicine

## 2019-06-27 DIAGNOSIS — Z23 Encounter for immunization: Secondary | ICD-10-CM | POA: Insufficient documentation

## 2019-06-27 NOTE — Progress Notes (Signed)
   Covid-19 Vaccination Clinic  Name:  Mariah Yang    MRN: [id number] DOB: 07-06-2000  06/27/2019  [name] was observed post Covid-19 immunization for 15 minutes without incidence. She was provided with Vaccine Information Sheet and instruction to access the V-Safe system.   [name] was instructed to call 911 with any severe reactions post vaccine: Marland Kitchen Difficulty breathing  . Swelling of your face and throat  . A fast heartbeat  . A bad rash all over your body  . Dizziness and weakness    Immunizations Administered    Name Date Dose VIS Date Route   Pfizer COVID-19 Vaccine 06/27/2019  4:23 AM 0.3 mL 04/08/2019 Intramuscular   Manufacturer: [employer], [employer]   Lot: [id number]   NDC: [address]-8

## 2019-07-20 ENCOUNTER — Ambulatory Visit: Payer: 59 | Attending: Internal Medicine

## 2019-07-20 DIAGNOSIS — Z23 Encounter for immunization: Secondary | ICD-10-CM

## 2019-07-20 NOTE — Progress Notes (Signed)
   Covid-19 Vaccination Clinic  Name:  Mariah Yang    MRN: [id number] DOB: 12-09-2000  07/20/2019  [name] was observed post Covid-19 immunization for 15 minutes without incident. She was provided with Vaccine Information Sheet and instruction to access the V-Safe system.   [name] was instructed to call 911 with any severe reactions post vaccine: Marland Kitchen Difficulty breathing  . Swelling of face and throat  . A fast heartbeat  . A bad rash all over body  . Dizziness and weakness   Immunizations Administered    Name Date Dose VIS Date Route   Pfizer COVID-19 Vaccine 07/20/2019  3:11 PM 0.3 mL 04/08/2019 Intramuscular   Manufacturer: [employer], [employer]   Lot: [id number]   NDC: [address]-5

## 2020-02-22 ENCOUNTER — Encounter (HOSPITAL_COMMUNITY): Payer: Self-pay

## 2020-02-22 ENCOUNTER — Other Ambulatory Visit: Payer: Self-pay

## 2020-02-22 ENCOUNTER — Emergency Department (HOSPITAL_COMMUNITY)
Admission: EM | Admit: 2020-02-22 | Discharge: 2020-02-22 | Disposition: A | Discharge status: Home / Self Care | Payer: BLUE CROSS/BLUE SHIELD | Attending: Emergency Medicine | Admitting: Emergency Medicine

## 2020-02-22 DIAGNOSIS — T148XXA Other injury of unspecified body region, initial encounter: Secondary | ICD-10-CM

## 2020-02-22 DIAGNOSIS — X58XXXA Exposure to other specified factors, initial encounter: Secondary | ICD-10-CM | POA: Insufficient documentation

## 2020-02-22 DIAGNOSIS — S86011A Strain of right Achilles tendon, initial encounter: Secondary | ICD-10-CM | POA: Insufficient documentation

## 2020-02-22 DIAGNOSIS — Z7982 Long term (current) use of aspirin: Secondary | ICD-10-CM | POA: Insufficient documentation

## 2020-02-22 DIAGNOSIS — S99911A Unspecified injury of right ankle, initial encounter: Secondary | ICD-10-CM | POA: Diagnosis present

## 2020-02-22 MED ORDER — METHOCARBAMOL 500 MG PO TABS: 500.0000 mg | ORAL_TABLET | Freq: Two times a day (BID) | ORAL | 0 refills | Status: AC

## 2020-02-22 NOTE — ED Provider Notes (Signed)
[facility]-EMERGENCY DEPT Provider Note   CSN: [id number] Arrival date & time: 02/22/20  2028     History Chief Complaint  Patient presents with  . Ankle Pain    Mariah Yang is a 19 y.o. female.  19 year old female presents with pain to her right Achilles tendon.  States that started yesterday and starts the base of her heel and goes up to about her mid calf.  Pain is worse with walking.  Denies any shortness of breath or chest pain.  No new injuries.  Has used Tylenol with limited relief.        Past Medical History:  Diagnosis Date  . Pulmonary embolism [facility])     Patient Active Problem List   Diagnosis Date Noted  . On anticoagulant therapy 05/10/2018  . Iron deficiency anemia due to chronic blood loss 01/27/2018  . 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 Syncope 01/26/2018    Past Surgical History:  Procedure Laterality Date  . NO PAST SURGERIES       OB History    Gravida  0   Para  0   Term  0   Preterm  0   AB  0   Living  0     SAB  0   TAB  0   Ectopic  0   Multiple  0   Live Births  0           Family History  Problem Relation Age of Onset  . Healthy Mother   . Healthy Father   . Diabetes Other   . Cancer Neg Hx   . Stroke Neg Hx   . CAD Neg Hx   . Clotting disorder Neg Hx   . Lupus Neg Hx   . Kidney disease Neg Hx     Social History   Tobacco Use  . Smoking status: Never Smoker  . Smokeless tobacco: Never Used  Vaping Use  . Vaping Use: Never used  Substance Use Topics  . Alcohol use: Never  . Drug use: Never    Home Medications Prior to Admission medications   Medication Sig Start Date End Date Taking? Authorizing Provider  aspirin 81 MG chewable tablet Chew 81 mg by mouth daily.    [provider]  norethindrone (MICRONOR) 0.35 MG tablet Take 1 tablet (0.35 mg total) by mouth daily. 02/03/19   Burleson, Brand Males, NP  ferrous sulfate 325 (65 FE) MG EC tablet Take 1  tablet (325 mg total) by mouth 2 (two) times daily with a meal. Patient not taking: Reported on 08/16/2018 01/29/18 01/19/19  Calvert Cantor, MD    Allergies    Amoxicillin-pot clavulanate  Review of Systems   Review of Systems  All other systems reviewed and are negative.   Physical Exam Updated Vital Signs BP (!) 150/93 (BP Location: Right Arm)   Pulse 86   Temp 98.4 F (36.9 C) (Oral)   Resp 18   Ht 1.651 m (5\' 5" )   Wt 62.1 kg   SpO2 98%   BMI 22.80 kg/m   Physical Exam Vitals and nursing note reviewed.  Constitutional:      General: She is not in acute distress.    Appearance: Normal appearance. She is well-developed. She is not toxic-appearing.  HENT:     Head: Normocephalic and atraumatic.  Eyes:     General: Lids are normal.     Conjunctiva/sclera: Conjunctivae normal.     Pupils: Pupils  are equal, round, and reactive to light.  Neck:     Thyroid: No thyroid mass.     Trachea: No tracheal deviation.  Cardiovascular:     Rate and Rhythm: Normal rate and regular rhythm.     Heart sounds: Normal heart sounds. No murmur heard.  No gallop.   Pulmonary:     Effort: Pulmonary effort is normal. No respiratory distress.     Breath sounds: Normal breath sounds. No stridor. No decreased breath sounds, wheezing, rhonchi or rales.  Abdominal:     General: Bowel sounds are normal. There is no distension.     Palpations: Abdomen is soft.     Tenderness: There is no abdominal tenderness. There is no rebound.  Musculoskeletal:        General: No tenderness. Normal range of motion.     Cervical back: Normal range of motion and neck supple.       Legs:  Skin:    General: Skin is warm and dry.     Findings: No abrasion or rash.  Neurological:     Mental Status: She is alert and oriented to person, place, and time.     GCS: GCS eye subscore is 4. GCS verbal subscore is 5. GCS motor subscore is 6.     Cranial Nerves: No cranial nerve deficit.     Sensory: No sensory  deficit.  Psychiatric:        Speech: Speech normal.        Behavior: Behavior normal.     ED Results / Procedures / Treatments   Labs (all labs ordered are listed, but only abnormal results are displayed) Labs Reviewed - No data to display  EKG None  Radiology No results found.  Procedures Procedures (including critical care time)  Medications Ordered in ED Medications - No data to display  ED Course  I have reviewed the triage vital signs and the nursing notes.  Pertinent labs & imaging results that were available during my care of the patient were reviewed by me and considered in my medical decision making (see chart for details).    MDM Rules/Calculators/A&P                         Patient musculoskeletal strain.  Will prescribe Robaxin as that she cannot take ibuprofen.  Also recommend cold therapy  Final Clinical Impression(s) / ED Diagnoses Final diagnoses:  None    Rx / DC Orders ED Discharge Orders    None       Lorre Nick, MD 02/22/20 2128

## 2020-02-22 NOTE — ED Triage Notes (Signed)
Pt c/o pain and aching to right ankle. Pt states its hard to put pressure on right ankle. Pt denies injury/trauma to right ankle. Pt has hx of PE 2 years ago.

## 2020-07-02 ENCOUNTER — Encounter (INDEPENDENT_AMBULATORY_CARE_PROVIDER_SITE_OTHER): Payer: Self-pay

## 2020-07-03 ENCOUNTER — Ambulatory Visit (INDEPENDENT_AMBULATORY_CARE_PROVIDER_SITE_OTHER): Payer: BLUE CROSS/BLUE SHIELD | Admitting: Obstetrics & Gynecology

## 2020-07-03 ENCOUNTER — Encounter (INDEPENDENT_AMBULATORY_CARE_PROVIDER_SITE_OTHER): Payer: Self-pay | Admitting: Obstetrics & Gynecology

## 2020-07-03 ENCOUNTER — Ambulatory Visit: Payer: Self-pay

## 2020-07-03 VITALS — BP 130/78 | HR 86 | Temp 97.5°F | Wt 153.4 lb

## 2020-07-03 DIAGNOSIS — Z01419 Encounter for gynecological examination (general) (routine) without abnormal findings: Secondary | ICD-10-CM | POA: Insufficient documentation

## 2020-07-03 DIAGNOSIS — Z113 Encounter for screening for infections with a predominantly sexual mode of transmission: Secondary | ICD-10-CM

## 2020-07-03 LAB — SYPHILIS SCREEN IGG AND IGM: Syphilis Screen IgG and IgM: NONREACTIVE

## 2020-07-03 LAB — HEPATITIS C ANTIBODY: Hepatitis C, AB: NONREACTIVE

## 2020-07-03 LAB — THIN PREP PAP W/IMAGE ANALYSIS ,REFLEX HPV

## 2020-07-03 LAB — HIV-1/2 AG/AB 4TH GEN. W/ REFLEX: HIV Ag/Ab, 4th Generation: NONREACTIVE

## 2020-07-03 MED ORDER — NORETHINDRONE 0.35 MG PO TABS
1.0000 | ORAL_TABLET | Freq: Every day | ORAL | 3 refills | Status: AC
Start: 2020-07-03 — End: ?

## 2020-07-03 NOTE — Progress Notes (Signed)
HPI   20 yo G0P0 presents as NGYN for [employer] visit. 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 She has a h/o a PE while on combination OCP's. She was told by hematology that it is okay to take progesterone contraception and has taken Micronor in the past.   The following portions of the patient's history were reviewed and updated as appropriate: allergies, current medications, past family history, past medical history, past social history, past surgical history and problem list.   Past Medical History:   Diagnosis Date    Pulmonary embolus     age 27 was on OCP's at the time     History reviewed. No pertinent surgical history.  Family History   Problem Relation Age of Onset    Diabetes Paternal Grandmother     Hypertension Father     Preterm labor Mother     Hypertension Mother     Breast cancer Neg Hx     Colon cancer Neg Hx     Miscarriages / Stillbirths Neg Hx     Ovarian cancer Neg Hx     Stroke Neg Hx     Eclampsia Neg Hx     Cancer Neg Hx      Social History     Socioeconomic History    Marital status: Unknown   Tobacco Use    Smoking status: Never Smoker    Smokeless tobacco: Never Used   Substance and Sexual Activity    Alcohol use: Yes     Alcohol/week: 2.0 standard drinks     Types: 1 Glasses of wine, 1 Shots of liquor per week    Drug use: Never    Sexual activity: Yes     Partners: Male     Birth control/protection: Condom, OCP     OB History     Gravida   0    Para   0    Term   0    Preterm   0    AB   0    Living   0       SAB   0    IAB   0    Ectopic   0    Multiple   0    Live Births   0               Review of Systems   Constitutional: Negative.   HENT: Negative.   Eyes: Negative.   Respiratory: Negative.   Cardiovascular: Negative.   Gastrointestinal: Negative.   Endocrine: Negative.   Genitourinary: Negative for vaginal bleeding, vaginal discharge, genital sores, menstrual problem, pelvic pain and dyspareunia.    Allergic/Immunologic: Negative.   Neurological: Negative.   Hematological: Negative.   Psychiatric/Behavioral: Negative.     Vitals:    07/03/20 1336   BP: 130/78   Pulse: 86   Temp: 97.5 F (36.4 C)     Objective:    Physical Exam   Constitutional: She is oriented to person, place, and time. She appears well-developed and well-nourished.   HENT:   Head: Atraumatic.   Eyes: No scleral icterus.   Neck: No thyromegaly present.   Pulmonary/Chest: Effort normal.   Abdominal: Soft. She exhibits no distension. There is no tenderness.   Genitourinary: There is no rash on the right labia. There is no rash on the left labia. Uterus is not enlarged and not tender. Cervix exhibits  no discharge. Right adnexum displays no mass and no tenderness. Left adnexum displays no mass and no tenderness.   Neurological: She is alert and oriented to person, place, and time.   Skin: Skin is warm.   Psychiatric: She has a normal mood and affect. Her behavior is normal.     Breasts: breasts appear normal, no suspicious masses, no skin or nipple changes or axillary nodes    NGYN  WW  H/O PE    Pap  Cultures  STD testing  micronor   Will then follow up for [facility] IUD in the summer  Counseled about contraception

## 2020-07-04 LAB — GENITAL CHLAMYDIA/NEISSERIA BY PCR
Chlamydia DNA by PCR: NEGATIVE
Neisseria gonorrhoeae by PCR: NEGATIVE

## 2020-07-09 LAB — LAB USE ONLY - HISTORICAL GYN CYTOLOGY/PAP SMEAR

## 2020-08-30 ENCOUNTER — Ambulatory Visit (INDEPENDENT_AMBULATORY_CARE_PROVIDER_SITE_OTHER): Payer: BLUE CROSS/BLUE SHIELD | Admitting: Obstetrics & Gynecology

## 2020-09-03 IMAGING — CT CT ANGIO CHEST
2 of 6 series · 18 of 46 positions shown · IV contrast (ISOVUE)
Comparison: Chest x-ray January 26, 2018

CLINICAL DATA: Positive D-dimer test.

EXAM:
CT ANGIOGRAPHY CHEST WITH CONTRAST
TECHNIQUE: Multidetector CT imaging of the chest was performed using the
standard protocol during bolus administration of intravenous
contrast. Multiplanar CT image reconstructions and MIPs were
obtained to evaluate the vascular anatomy.
CONTRAST:  100mL [id number] IOPAMIDOL ([id number]) INJECTION 76%

[Series 5: thins · axial · 0.62mm/px · z∈[+860,+1075]mm · 15 of 237 slices shown]
[im 11/237  lung]
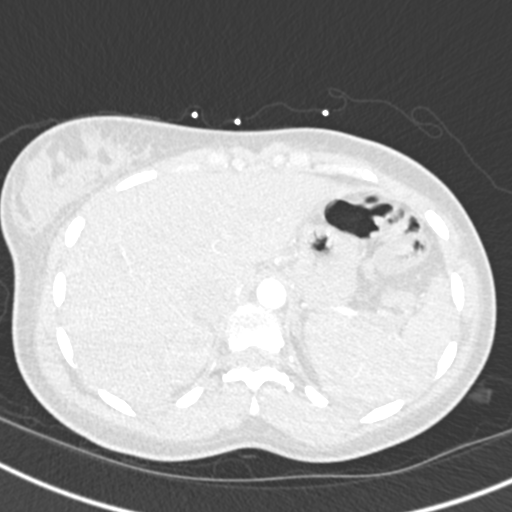
[im 31/237  soft-tissue]
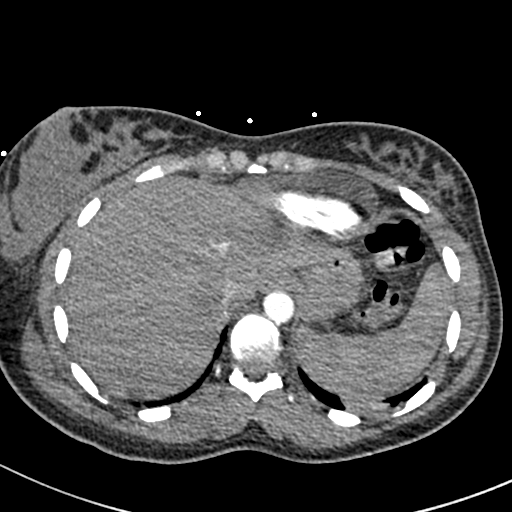
[im 42/237  lung]
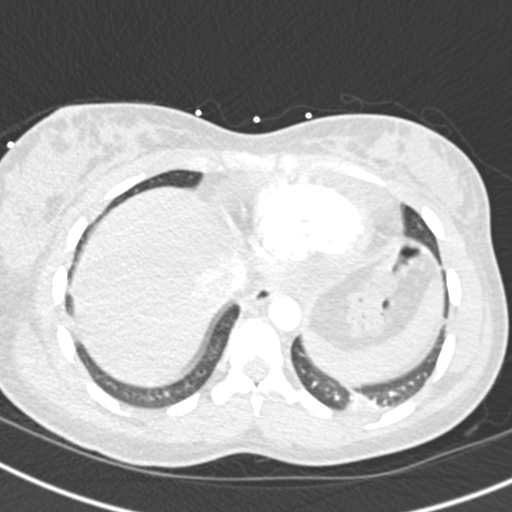
[im 62/237  soft-tissue]
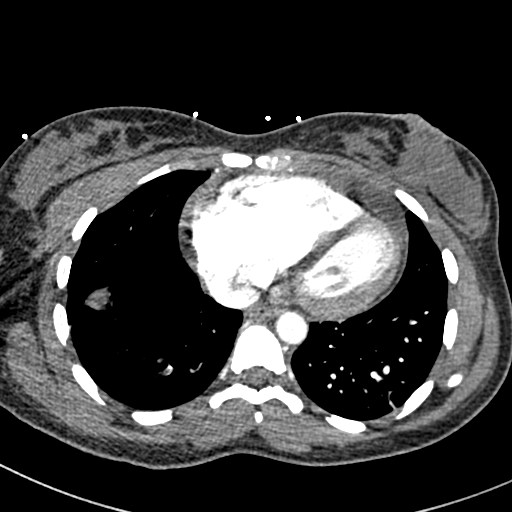
[im 72/237  lung]
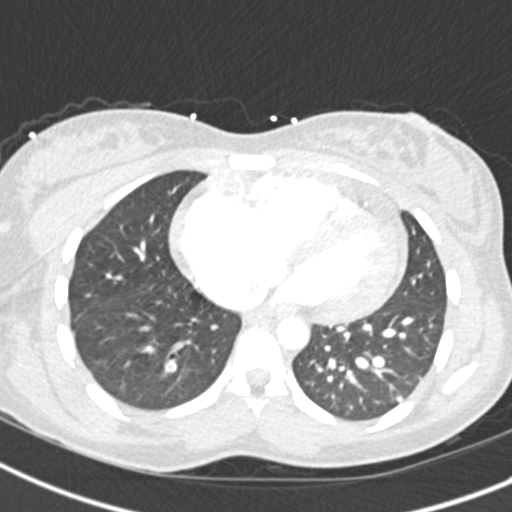
[im 93/237  soft-tissue]
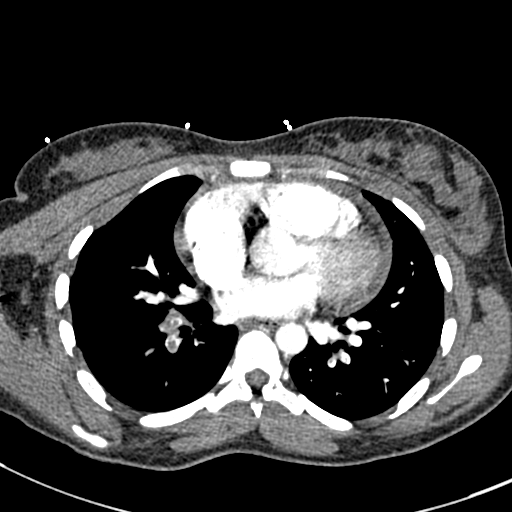
[im 103/237  lung]
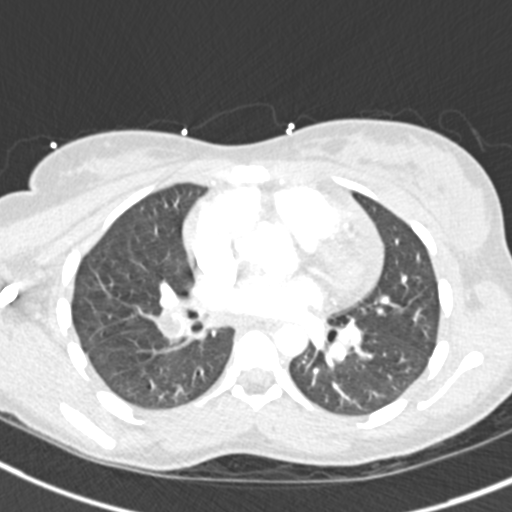
[im 124/237  soft-tissue]
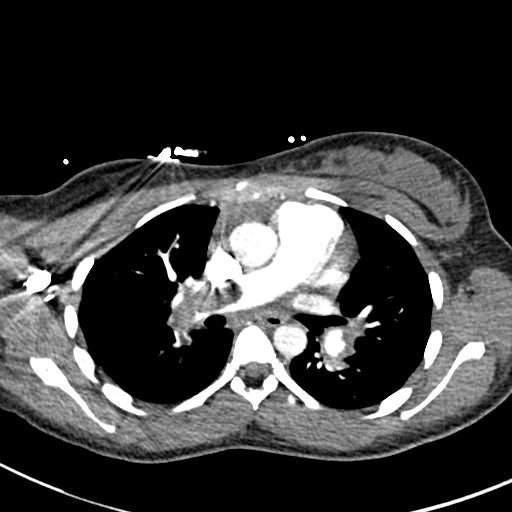
[im 134/237  lung]
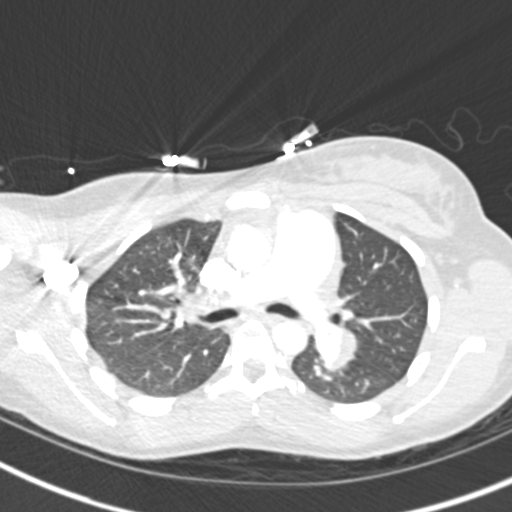
[im 144/237  soft-tissue]
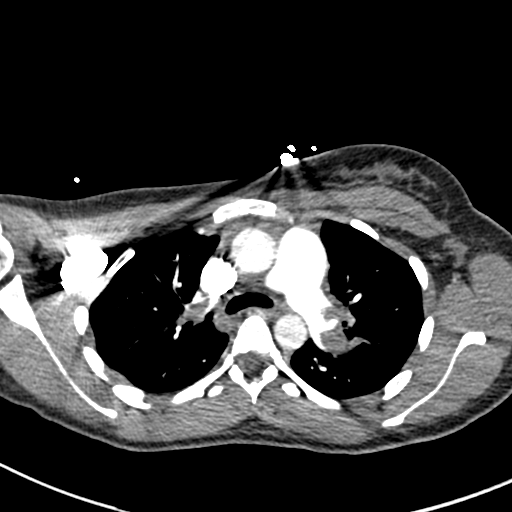
[im 165/237  lung]
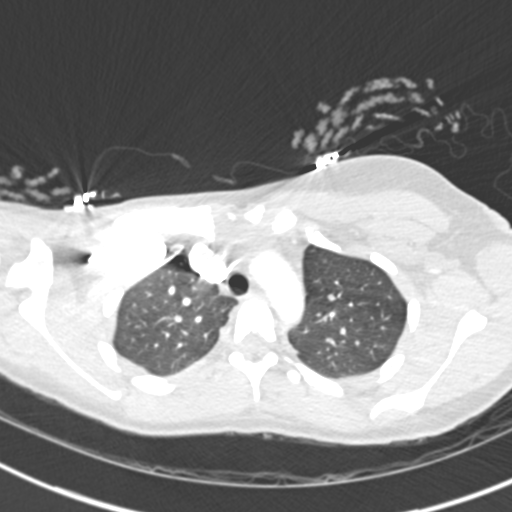
[im 175/237  soft-tissue]
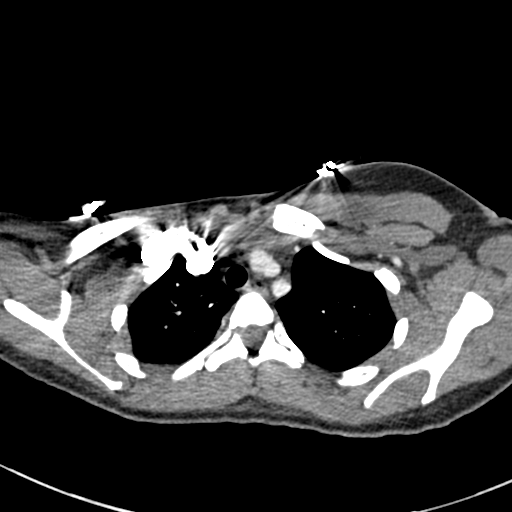
[im 195/237  lung]
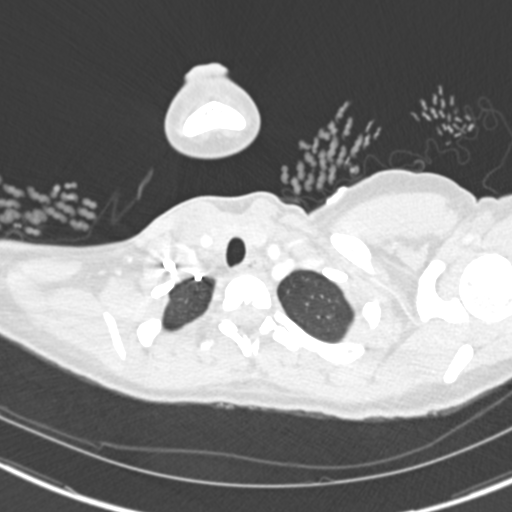
[im 206/237  soft-tissue]
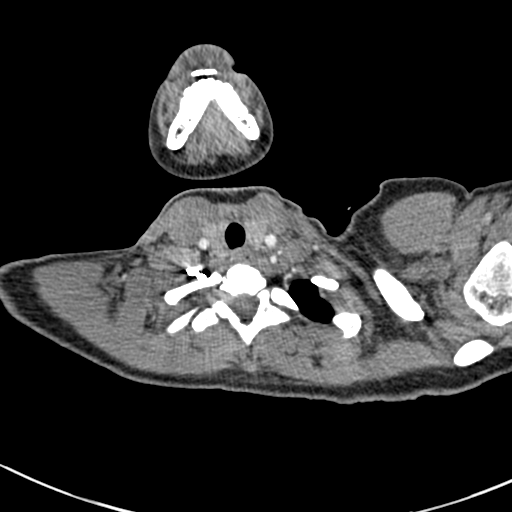
[im 226/237  lung]
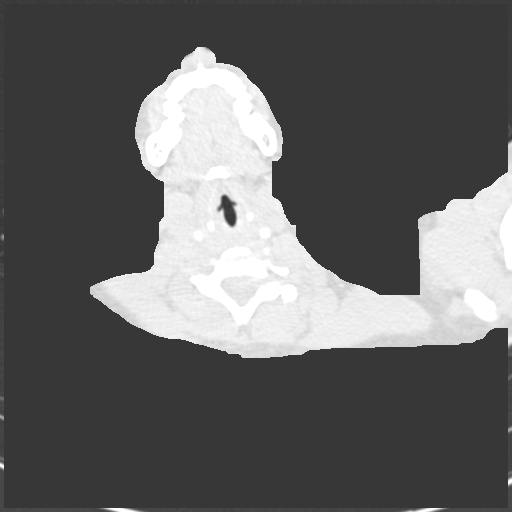

[Series 7: coronal mpr · coronal · 0.59mm/px · 3 of 151 slices shown]
[im 38/151  soft-tissue]
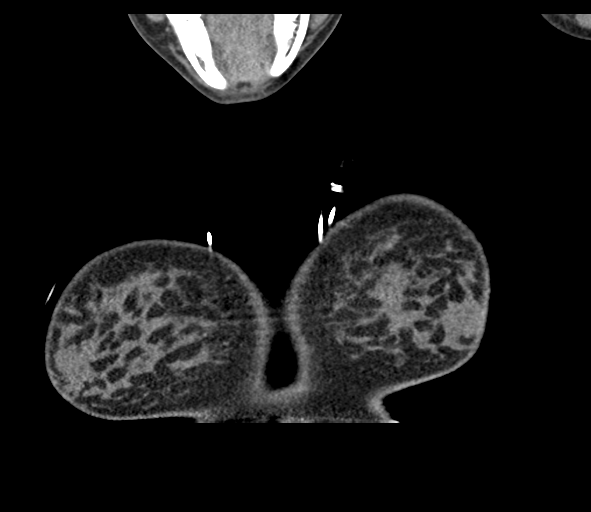
[im 76/151  soft-tissue]
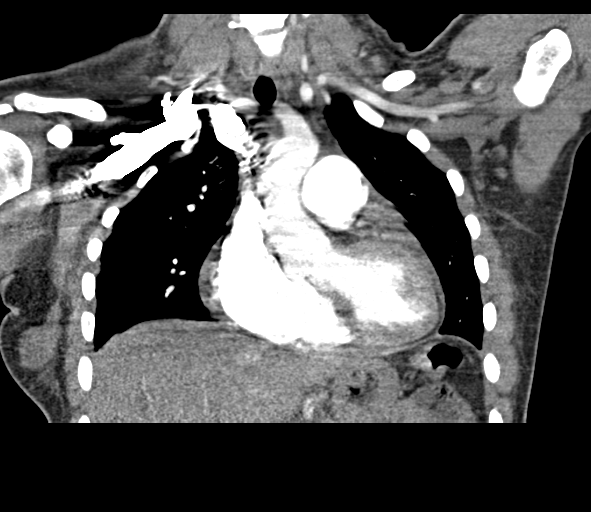
[im 113/151  soft-tissue]
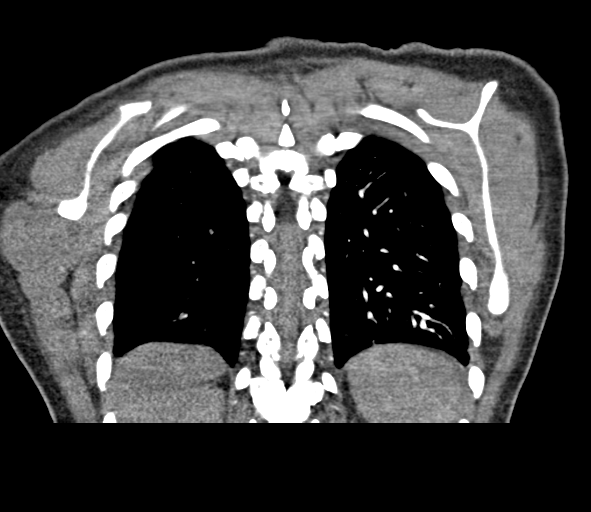

[18 of 46 positions shown; findings below may reference images not displayed]

FINDINGS: Cardiovascular: Large pulmonary embolus is identified in the right
main pulmonary artery and in the bilateral segmental pulmonary
arteries of all lobes. The aorta is normal. The heart size is
enlarged. There is a small pericardial effusion. The right
ventricular to left ventricular ratio is 1.57.

Mediastinum/Nodes: No enlarged mediastinal, hilar, or axillary lymph
nodes. Thyroid gland, trachea, and esophagus demonstrate no
significant findings.

Lungs/Pleura: There pulmonary infarcts involving the bilateral lower
lobes there is no pleural effusion.

Upper Abdomen: No acute abnormality.

Musculoskeletal: No chest wall abnormality. No acute or significant
osseous findings.

Review of the MIP images confirms the above findings.
IMPRESSION: Massive pulmonary embolus in the right main pulmonary artery with
pulmonary embolus involving bilateral segment total pulmonary
arteries of all lobes. There is evidence of right heart strain.
Pulmonary infarcts are identified in bilateral lower lobes.

Cardiomegaly.  Small pericardial effusion.

Critical Value/emergent results were called by telephone at the time
of interpretation on 01/26/2018 at [DATE] to Dr. MYNUL ISLAM MINTO , who
verbally acknowledged these results.
